# Patient Record
Sex: Female | Born: 1935 | Race: White | Hispanic: No | Marital: Single | Smoking: Never smoker
Health system: Southern US, Community
[De-identification: ages and names within clinical notes are randomized; demographics above are authoritative.]

## PROBLEM LIST (undated history)

## (undated) DIAGNOSIS — M1990 Unspecified osteoarthritis, unspecified site: Secondary | ICD-10-CM

## (undated) DIAGNOSIS — R296 Repeated falls: Secondary | ICD-10-CM

## (undated) DIAGNOSIS — Z87898 Personal history of other specified conditions: Secondary | ICD-10-CM

## (undated) DIAGNOSIS — R531 Weakness: Secondary | ICD-10-CM

## (undated) HISTORY — PX: WRIST SURGERY: SHX841

---

## 2005-08-24 ENCOUNTER — Other Ambulatory Visit: Payer: Self-pay

## 2005-08-24 ENCOUNTER — Inpatient Hospital Stay: Payer: Self-pay | Admitting: Orthopaedic Surgery

## 2005-09-06 ENCOUNTER — Emergency Department: Payer: Self-pay | Admitting: Internal Medicine

## 2007-03-03 ENCOUNTER — Ambulatory Visit: Payer: Self-pay | Admitting: Family Medicine

## 2007-06-16 ENCOUNTER — Ambulatory Visit: Payer: Self-pay | Admitting: Family Medicine

## 2007-06-27 ENCOUNTER — Ambulatory Visit: Payer: Self-pay | Admitting: Family Medicine

## 2008-02-25 ENCOUNTER — Encounter: Payer: Self-pay | Admitting: Internal Medicine

## 2008-03-10 ENCOUNTER — Encounter: Payer: Self-pay | Admitting: Internal Medicine

## 2008-08-02 ENCOUNTER — Ambulatory Visit: Payer: Self-pay | Admitting: Family Medicine

## 2009-10-16 ENCOUNTER — Ambulatory Visit: Payer: Self-pay | Admitting: Internal Medicine

## 2009-11-16 ENCOUNTER — Ambulatory Visit: Payer: Self-pay | Admitting: Family Medicine

## 2009-11-25 ENCOUNTER — Inpatient Hospital Stay: Payer: Self-pay | Admitting: General Practice

## 2009-12-02 ENCOUNTER — Emergency Department: Payer: Self-pay | Admitting: Emergency Medicine

## 2010-01-02 ENCOUNTER — Ambulatory Visit: Payer: Self-pay | Admitting: Family Medicine

## 2010-01-04 ENCOUNTER — Ambulatory Visit: Payer: Self-pay | Admitting: Family Medicine

## 2010-02-02 ENCOUNTER — Ambulatory Visit: Payer: Self-pay | Admitting: Internal Medicine

## 2010-02-09 ENCOUNTER — Ambulatory Visit: Payer: Self-pay | Admitting: Internal Medicine

## 2010-02-13 ENCOUNTER — Ambulatory Visit: Payer: Self-pay | Admitting: Internal Medicine

## 2010-02-20 ENCOUNTER — Ambulatory Visit: Payer: Self-pay | Admitting: Internal Medicine

## 2010-02-27 ENCOUNTER — Ambulatory Visit: Payer: Self-pay | Admitting: Internal Medicine

## 2010-03-06 ENCOUNTER — Ambulatory Visit: Payer: Self-pay | Admitting: Internal Medicine

## 2010-03-10 ENCOUNTER — Ambulatory Visit: Payer: Self-pay | Admitting: Internal Medicine

## 2010-03-15 ENCOUNTER — Ambulatory Visit: Payer: Self-pay | Admitting: Internal Medicine

## 2010-03-21 ENCOUNTER — Ambulatory Visit: Payer: Self-pay | Admitting: Internal Medicine

## 2010-03-28 ENCOUNTER — Ambulatory Visit: Payer: Self-pay | Admitting: Internal Medicine

## 2010-04-04 ENCOUNTER — Ambulatory Visit: Payer: Self-pay | Admitting: Internal Medicine

## 2011-08-30 ENCOUNTER — Ambulatory Visit: Payer: Self-pay | Admitting: Family Medicine

## 2012-03-11 ENCOUNTER — Ambulatory Visit: Payer: Self-pay | Admitting: Family Medicine

## 2013-04-01 ENCOUNTER — Ambulatory Visit: Payer: Self-pay | Admitting: Family Medicine

## 2013-04-14 ENCOUNTER — Ambulatory Visit: Payer: Self-pay | Admitting: Family Medicine

## 2013-07-12 ENCOUNTER — Observation Stay: Payer: Self-pay | Admitting: Internal Medicine

## 2013-07-12 LAB — COMPREHENSIVE METABOLIC PANEL
Albumin: 3.3 g/dL — ABNORMAL LOW (ref 3.4–5.0)
Alkaline Phosphatase: 84 U/L (ref 50–136)
Anion Gap: 1 — ABNORMAL LOW (ref 7–16)
BUN: 20 mg/dL — ABNORMAL HIGH (ref 7–18)
Bilirubin,Total: 0.2 mg/dL (ref 0.2–1.0)
Calcium, Total: 8.9 mg/dL (ref 8.5–10.1)
Chloride: 109 mmol/L — ABNORMAL HIGH (ref 98–107)
Creatinine: 0.87 mg/dL (ref 0.60–1.30)
EGFR (African American): >60
EGFR (Non-African Amer.): 59 — ABNORMAL LOW
Glucose: 107 mg/dL — ABNORMAL HIGH (ref 65–99)
Osmolality: 282 (ref 275–301)
SGPT (ALT): 20 U/L (ref 12–78)
Sodium: 140 mmol/L (ref 136–145)
Total Protein: 6.7 g/dL (ref 6.4–8.2)

## 2013-07-12 LAB — URINALYSIS, COMPLETE
Bilirubin,UR: NEGATIVE
Blood: NEGATIVE
Glucose,UR: NEGATIVE mg/dL (ref 0–75)
Ketone: NEGATIVE
Leukocyte Esterase: NEGATIVE
Ph: 5 (ref 4.5–8.0)
Protein: NEGATIVE
Specific Gravity: 1.02 (ref 1.003–1.030)
WBC UR: 1 /HPF (ref 0–5)

## 2013-07-12 LAB — CBC
HCT: 39.4 % (ref 35.0–47.0)
HGB: 13.6 g/dL (ref 12.0–16.0)
MCHC: 34.6 g/dL (ref 32.0–36.0)
Platelet: 186 10*3/uL (ref 150–440)
RDW: 13.2 % (ref 11.5–14.5)

## 2013-07-13 LAB — CBC WITH DIFFERENTIAL/PLATELET
Eosinophil %: 1.4 %
HGB: 12.3 g/dL (ref 12.0–16.0)
Lymphocyte %: 10.8 %
MCH: 33.5 pg (ref 26.0–34.0)
MCHC: 34.8 g/dL (ref 32.0–36.0)
MCV: 96 fL (ref 80–100)
Monocyte #: 0.5 x10 3/mm (ref 0.2–0.9)
Neutrophil #: 4.7 10*3/uL (ref 1.4–6.5)
Platelet: 173 10*3/uL (ref 150–440)
RBC: 3.69 10*6/uL — ABNORMAL LOW (ref 3.80–5.20)

## 2013-07-13 LAB — BASIC METABOLIC PANEL
Chloride: 111 mmol/L — ABNORMAL HIGH (ref 98–107)
Creatinine: 1.01 mg/dL (ref 0.60–1.30)
EGFR (African American): 57 — ABNORMAL LOW
EGFR (Non-African Amer.): 49 — ABNORMAL LOW
Glucose: 95 mg/dL (ref 65–99)
Osmolality: 284 (ref 275–301)
Potassium: 4.3 mmol/L (ref 3.5–5.1)
Sodium: 142 mmol/L (ref 136–145)

## 2013-09-21 ENCOUNTER — Ambulatory Visit: Payer: Self-pay | Admitting: Family Medicine

## 2014-10-07 ENCOUNTER — Inpatient Hospital Stay: Payer: Self-pay | Admitting: Internal Medicine

## 2014-10-07 LAB — COMPREHENSIVE METABOLIC PANEL
ALBUMIN: 2.9 g/dL — AB (ref 3.4–5.0)
Alkaline Phosphatase: 111 U/L (ref 46–116)
Anion Gap: 5 — ABNORMAL LOW (ref 7–16)
BUN: 23 mg/dL — AB (ref 7–18)
Bilirubin,Total: 0.3 mg/dL (ref 0.2–1.0)
Calcium, Total: 8.7 mg/dL (ref 8.5–10.1)
Chloride: 105 mmol/L (ref 98–107)
Co2: 31 mmol/L (ref 21–32)
Creatinine: 0.88 mg/dL (ref 0.60–1.30)
EGFR (Non-African Amer.): >60
GLUCOSE: 140 mg/dL — AB (ref 65–99)
Osmolality: 287 (ref 275–301)
POTASSIUM: 4.1 mmol/L (ref 3.5–5.1)
SGOT(AST): 32 U/L (ref 15–37)
SGPT (ALT): 20 U/L (ref 14–63)
Sodium: 141 mmol/L (ref 136–145)
Total Protein: 6.6 g/dL (ref 6.4–8.2)

## 2014-10-07 LAB — CBC
HCT: 38.6 % (ref 35.0–47.0)
HGB: 12.8 g/dL (ref 12.0–16.0)
MCH: 32.3 pg (ref 26.0–34.0)
MCHC: 33.2 g/dL (ref 32.0–36.0)
MCV: 97 fL (ref 80–100)
PLATELETS: 183 10*3/uL (ref 150–440)
RBC: 3.97 10*6/uL (ref 3.80–5.20)
RDW: 13.8 % (ref 11.5–14.5)
WBC: 4.2 10*3/uL (ref 3.6–11.0)

## 2014-10-07 LAB — URINALYSIS, COMPLETE
BILIRUBIN, UR: NEGATIVE
Bacteria: NONE SEEN
Blood: NEGATIVE
Glucose,UR: NEGATIVE mg/dL (ref 0–75)
KETONE: NEGATIVE
LEUKOCYTE ESTERASE: NEGATIVE
Nitrite: NEGATIVE
PROTEIN: NEGATIVE
Ph: 7 (ref 4.5–8.0)
RBC, UR: NONE SEEN /HPF (ref 0–5)
Specific Gravity: 1.018 (ref 1.003–1.030)
Squamous Epithelial: <1
WBC UR: <1 /HPF (ref 0–5)

## 2014-10-08 LAB — CBC WITH DIFFERENTIAL/PLATELET
BASOS PCT: 0.3 %
Basophil #: 0 10*3/uL (ref 0.0–0.1)
Eosinophil #: 0 10*3/uL (ref 0.0–0.7)
Eosinophil %: 0.4 %
HCT: 31.5 % — AB (ref 35.0–47.0)
HGB: 10.4 g/dL — AB (ref 12.0–16.0)
LYMPHS PCT: 8.5 %
Lymphocyte #: 0.6 10*3/uL — ABNORMAL LOW (ref 1.0–3.6)
MCH: 32.3 pg (ref 26.0–34.0)
MCHC: 32.9 g/dL (ref 32.0–36.0)
MCV: 98 fL (ref 80–100)
Monocyte #: 0.5 x10 3/mm (ref 0.2–0.9)
Monocyte %: 6.8 %
NEUTROS ABS: 5.7 10*3/uL (ref 1.4–6.5)
Neutrophil %: 84 %
Platelet: 163 10*3/uL (ref 150–440)
RBC: 3.21 10*6/uL — ABNORMAL LOW (ref 3.80–5.20)
RDW: 13.6 % (ref 11.5–14.5)
WBC: 6.7 10*3/uL (ref 3.6–11.0)

## 2014-10-08 LAB — BASIC METABOLIC PANEL
ANION GAP: 5 — AB (ref 7–16)
BUN: 26 mg/dL — ABNORMAL HIGH (ref 7–18)
CHLORIDE: 108 mmol/L — AB (ref 98–107)
CREATININE: 0.78 mg/dL (ref 0.60–1.30)
Calcium, Total: 8.1 mg/dL — ABNORMAL LOW (ref 8.5–10.1)
Co2: 27 mmol/L (ref 21–32)
EGFR (Non-African Amer.): >60
Glucose: 117 mg/dL — ABNORMAL HIGH (ref 65–99)
Osmolality: 285 (ref 275–301)
Potassium: 4.2 mmol/L (ref 3.5–5.1)
Sodium: 140 mmol/L (ref 136–145)

## 2014-10-09 LAB — CBC WITH DIFFERENTIAL/PLATELET
BASOS ABS: 0 10*3/uL (ref 0.0–0.1)
Basophil %: 0.3 %
Eosinophil #: 0.2 10*3/uL (ref 0.0–0.7)
Eosinophil %: 3.8 %
HCT: 24.6 % — ABNORMAL LOW (ref 35.0–47.0)
HGB: 8 g/dL — AB (ref 12.0–16.0)
LYMPHS ABS: 0.3 10*3/uL — AB (ref 1.0–3.6)
Lymphocyte %: 4.8 %
MCH: 32 pg (ref 26.0–34.0)
MCHC: 32.4 g/dL (ref 32.0–36.0)
MCV: 99 fL (ref 80–100)
MONO ABS: 0.5 x10 3/mm (ref 0.2–0.9)
Monocyte %: 8.4 %
Neutrophil #: 4.6 10*3/uL (ref 1.4–6.5)
Neutrophil %: 82.7 %
PLATELETS: 118 10*3/uL — AB (ref 150–440)
RBC: 2.49 10*6/uL — AB (ref 3.80–5.20)
RDW: 13.8 % (ref 11.5–14.5)
WBC: 5.6 10*3/uL (ref 3.6–11.0)

## 2014-10-09 LAB — BASIC METABOLIC PANEL
Anion Gap: 7 (ref 7–16)
BUN: 19 mg/dL — AB (ref 7–18)
CHLORIDE: 110 mmol/L — AB (ref 98–107)
CO2: 26 mmol/L (ref 21–32)
CREATININE: 0.87 mg/dL (ref 0.60–1.30)
Calcium, Total: 7.4 mg/dL — ABNORMAL LOW (ref 8.5–10.1)
Glucose: 86 mg/dL (ref 65–99)
Osmolality: 287 (ref 275–301)
POTASSIUM: 4.1 mmol/L (ref 3.5–5.1)
SODIUM: 143 mmol/L (ref 136–145)

## 2014-10-10 LAB — HEMOGLOBIN
HGB: 7.7 g/dL — AB (ref 12.0–16.0)
HGB: 10 g/dL — AB (ref 12.0–16.0)

## 2014-10-10 LAB — BASIC METABOLIC PANEL
ANION GAP: 5 — AB (ref 7–16)
BUN: 16 mg/dL (ref 7–18)
CHLORIDE: 112 mmol/L — AB (ref 98–107)
CREATININE: 0.77 mg/dL (ref 0.60–1.30)
Calcium, Total: 7.9 mg/dL — ABNORMAL LOW (ref 8.5–10.1)
Co2: 24 mmol/L (ref 21–32)
EGFR (African American): >60
GLUCOSE: 90 mg/dL (ref 65–99)
Osmolality: 282 (ref 275–301)
POTASSIUM: 3.6 mmol/L (ref 3.5–5.1)
Sodium: 141 mmol/L (ref 136–145)

## 2014-12-31 NOTE — Consult Note (Signed)
Brief Consult Note: Diagnosis: left knee.thigh pain post trauma.   Patient was seen by consultant.   Recommend further assessment or treatment.   Comments: severe pain with leg ROM, nontender in back recommend CT left femur looking for occult fracture.  Electronic Signatures: Leitha SchullerMenz, Syvilla Martin J (MD)  (Signed [phone]-Nov-14 07:40)  Authored: Brief Consult Note   Last Updated: 03-Nov-14 07:40 by Leitha SchullerMenz, Precilla Purnell J (MD)

## 2014-12-31 NOTE — H&P (Signed)
PATIENT NAME:  Anna Moss, Dalissa MR#:  [id number] DATE OF BIRTH:  January 05, 1921  DATE OF ADMISSION:  07/12/2013  REFERRING PHYSICIAN: Dr. Darnelle CatalanMalinda  FAMILY PHYSICIAN: Dr. Elizabeth Sauereanna Jones   REASON FOR ADMISSION: Severe left lower extremity pain, with inability to ambulate.   HISTORY OF PRESENT ILLNESS: The patient is a 81 year old female with a history of osteoporosis and multiple orthopedic surgeries, who fell today, injuring her left side. She was brought to the Emergency Room, where plain films of her hip, knee, pelvis and shoulder were negative for fracture. She was having severe pain in the Emergency Room, involving her left knee and leg. She is unable to bear weight or ambulate. She has gotten morphine, with no improvement. She is noted to be mildly dehydrated. She is now admitted for further evaluation.   PAST MEDICAL HISTORY: 1.  Osteoporosis.  2.  Osteoarthritis.  3.  Status post right knee surgery.  4.  Status post left shoulder surgery.  5.  Status post left hip surgery.  6.  Status post back surgery.  7.  Status post ankle surgery.   MEDICATIONS: 1.  Actonel 35 mg p.o. q. week.  2.  Tramadol 50 mg p.o. q. 6 hours p.r.n. pain.   ALLERGIES:  No known drug allergies.   SOCIAL HISTORY:  Negative for alcohol or tobacco abuse.   FAMILY HISTORY:  Noncontributory.   REVIEW OF SYSTEMS:    CONSTITUTIONAL: No fever or change in weight.  EYES: No blurred or double vision. No glaucoma.  ENT: No dizziness. No tinnitus or hearing loss. No nasal discharge or bleeding.  RESPIRATORY:  No cough or wheezing. Denies hemoptysis.  CARDIOVASCULAR:  No chest pain or orthopnea. No palpitations or syncope.  GASTROINTESTINAL: No nausea, vomiting, or diarrhea. No abdominal pain.  GENITOURINARY: No dysuria or hematuria. No incontinence.  ENDOCRINE: No polyuria or polydipsia. No heat or cold intolerance.  HEMATOLOGIC: The patient denies anemia, easy bruising or bleeding.  LYMPHATIC: No swollen  glands.  MUSCULOSKELETAL: The patient denies pain in her neck. No gout.  NEUROLOGIC: No numbness or migraines. Denies stroke or seizures.  PSYCHIATRIC: The patient denies anxiety, insomnia or depression.   PHYSICAL EXAMINATION: GENERAL: The patient is extremely anxious, but in no acute distress.  VITAL SIGNS: Currently remarkable for a blood pressure of 154/78, with a heart rate of 67, a respiratory rate of 16, and a temperature of 97.8.  HEENT: Normocephalic, atraumatic. Pupils equally round and reactive to light and accommodation. Extraocular movements are intact. Sclerae are not icteric. Conjunctivae are clear. Oropharynx is clear.  NECK:  Supple, without JVD. No adenopathy or thyromegaly is noted.  LUNGS: Clear to auscultation and percussion, without wheezes, rales, or rhonchi. No dullness. Respiratory effort is normal.  CARDIAC: Regular rate and rhythm. Normal S1 and S2. There is a 3/6 systolic murmur noted throughout the precordium. No rubs or gallops are present. PMI is nondisplaced.  ABDOMEN: Soft, nontender, with normoactive bowel sounds. No organomegaly or masses were appreciated. No hernias or bruits were noted.  EXTREMITIES: Without clubbing, cyanosis or edema. Pulses were 2+ bilaterally.  SKIN: Warm and dry, without rash or lesions.  NEUROLOGIC: Cranial nerves II through XII grossly intact. Deep tendon reflexes were symmetric. Motor and sensory examination is nonfocal.  PSYCHIATRIC: Revealed patient who was alert and oriented to person, place, and time. She was cooperative and used good judgment.   LABORATORY DATA: Plain films of the left hip, left knee, and left shoulder show severe osteoporosis and arthrosis, with  no acute fracture. White count is 6.0, with a hemoglobin of 13.6. Glucose is 107, with a BUN of 20, creatinine of 0.87, with a sodium of 140, potassium of 4.1.   ASSESSMENT: 1.  Dehydration.  2.  Ambulatory dysfunction.  3.  Severe left lower extremity pain.  4.   Osteoporosis. 5.  Osteoarthritis.  6.  Diffuse weakness.   PLAN: The patient will be admitted to Orthopedics, with IV morphine as needed for pain. Will begin Voltaren gel and use Ativan as needed for anxiety. Will continue her Actonel. Zofran as needed for nausea. Will monitor her blood pressure closely. Will obtain consults from Orthopedics and Physical Therapy. Will also consult the social worker for placement. Will send off urinalysis at this time, and obtain an EKG. Will check an echocardiogram because of her loud murmur. Further treatment and evaluation will depend upon the patient's progress.   Total time spent on this patient was 50 minutes.    ____________________________ Duane Lope Judithann Sheen, MD jds:mr D: 07/12/2013 19:03:41 ET T: 07/12/2013 19:54:18 ET JOB#: [id number]  cc: Duane Lope. Judithann Sheen, MD, <Dictator> Duanne Limerick, MD  Noora Locascio Rodena Medin MD ELECTRONICALLY SIGNED 07/13/2013 7:32

## 2014-12-31 NOTE — Discharge Summary (Signed)
PATIENT NAME:  Anna Moss, Anna MR#:  [id number] DATE OF BIRTH:  07-30-1921  DATE OF ADMISSION:  07/12/2013 DATE OF DISCHARGE:  07/13/2013  ADMISSION DIAGNOSIS: Ambulatory dysfunction secondary to left leg pain.   DISCHARGE DIAGNOSES: 1. Left leg pain with ambulatory dysfunction. No evidence of a fracture on CT scan.  2. Osteoporosis.  3. Severe mitral regurgitation.   CONSULTATIONS: Dr. Rosita KeaMenz.   IMAGING: CT scan of the femur showed no evidence of any femur fracture and no evidence of hip injury.   Echocardiogram ejection fraction 55% to 60% with moderately dilated left atrium, severe mitral regurgitation with moderately elevated PAPs.   Discharge white blood cells 6, hemoglobin 12.3, hematocrit 36, platelets 173, sodium 142, potassium 4.3, chloride 111, bicarb 29, BUN 17, creatinine 1.01, glucose 95.   HOSPITAL COURSE: This is a very pleasant 81 year old female with a history of osteoporosis who presented with excruciating left leg pain after a fall on the left side. For further details, please refer to the H and P.  1. Ambulatory dysfunction with left leg pain. The patient was admitted under observation for further evaluation for the left leg pain. Physical therapy was consulted as well as orthopedic surgery. Dr. Rosita KeaMenz recommended a CT scan to evaluate for fracture. This was negative. He; however, does recommend if the patient is still having ambulatory issues in the next week or so. He recommends follow-up with him for possible bone scan to look for occult fracture. The patient is receiving some p.r.n. tramadol, which is controlling her pain.  2. Severe mitral regurgitation. We will do defer this to Dr. Elizabeth Sauereanna Jones her primary care physician.  3. Osteoporosis. The patient will continue outpatient medications.   DISCHARGE MEDICATIONS: 1. Actonel  35 mg weekly.  2. Tramadol 50 mg b.i.d.  3. Tramadol 50 mg q.6h. p.r.n.  4. DICLOFENLAC  1% topical to affected area.  5. Tylenol 325, 2  tablets q.4 hours p.r.n. pain or temperature.   DISCHARGE DIET: Regular diet.   DISCHARGE FOLLOW-UP: The patient will follow up with Dr. Rosita KeaMenz in 1 to 2 weeks as well as with Dr. Elizabeth Sauereanna Jones.    TIME SPENT: Approximately 35 minutes.   The patient is medically stable for discharge.   ____________________________ Tai Syfert P. Juliene PinaMody, MD spm:sg D: 07/13/2013 13:51:29 ET T: 07/13/2013 14:13:14 ET JOB#: [id number]  cc: Makhi Muzquiz P. Juliene PinaMody, MD, <Dictator> Leitha SchullerMichael J. Menz, MD Duanne Limerickeanna C. Jones, MD  Janyth ContesSITAL P Kendyl Bissonnette MD ELECTRONICALLY SIGNED 07/13/2013 15:33

## 2015-01-09 NOTE — Op Note (Signed)
PATIENT NAME:  Anna Moss, Anna Moss MR#:  [id number] DATE OF BIRTH:  Nov 15, 1920  DATE OF PROCEDURE:  10/08/2014  PREOPERATIVE DIAGNOSES:  1.  Three part intertrochanteric fracture, right hip.  2.  Displaced right distal radius fracture.  POSTOPERATIVE DIAGNOSES:  1.  Three part intertrochanteric fracture, right hip.  2.  Displaced right distal radius fracture.  PROCEDURE:  1.  Trochanteric femoral nailing of right hip intertrochanteric fracture using a Biomet Affixus 11x380 mm nail with proximal and distal interlocking screws.  2.  Closed reduction and percutaneous pinning of right distal radius fracture.   SURGEON: Maryagnes Amos, MD   ANESTHESIA: Spinal with a supplemental LMA for the distal radius procedure.   FINDINGS: As noted above.   COMPLICATIONS: None.   ESTIMATED BLOOD LOSS: 50 mL.   TOTAL FLUIDS: Crystalloid 1200 mL.   URINE OUTPUT: 150 mL   TOURNIQUET: None.   DRAINS: None.   CLOSURE: Staples.   BRIEF CLINICAL NOTE: The patient is a 81 year old female who sustained the above-noted injuries yesterday afternoon when she apparently fell in her apartment while trying to close the dryer door in her laundry room. She was brought to the Emergency Room where x-rays confirmed the above-noted findings. She has been cleared medically and presents at this time for definitive management of her injuries.   PROCEDURE IN DETAIL: The patient was brought into the operating room. After adequate IV sedation was achieved, a spinal was placed by the anesthesiologist. She was lain in the supine position on the fracture table. The right wrist was approached first. After a Foley catheter was placed and the patient underwent general laryngeal mask anesthesia, the right distal radius fracture was reduced gently using manipulation and longitudinal traction through fingertraps and 5 pounds of countertraction. The adequacy of reduction was verified fluoroscopically in AP and lateral  projections. The wrist area was prepped with ChloraPrep solution before being sterilely draped. Using sterile technique, two 0.062 K-wires were placed to stabilize the fracture. The first was placed through the radial styloid obliquely directed from radial to ulnar and distal to proximal across the fracture site. The second K-wire was placed through Lister tubercle dorsally and advanced proximally and volarly. The adequacy of reduction and pin position was, again, verified fluoroscopically in AP and lateral projections and found to be quite satisfactory. The pins were cut short and pin caps applied. A sterile bulky dressing was applied around the pin sites before the patient was placed into a sugar tong splint with the wrist maintained at neutral position.   Attention was directed to the hip. The patient was repositioned on the fracture table. Her left leg was placed in a flexed and abducted position on the well leg holder while the right leg was put in longitudinal traction. The fracture was carefully reduced using longitudinal traction and internal rotation. The adequacy of reduction was verified fluoroscopically in AP and lateral projections and found to be near anatomic. The lateral aspect of the right hip and thigh were prepped with ChloraPrep solution before being draped sterilely. Preoperative antibiotics had been administered already. Under fluoroscopic guidance, the center of the femur was marked on the skin on both the anterior and lateral planes. The tip of the trochanter also was identified and this point marked on the lateral aspect of her hip. An approximately 3-4 cm incision was made approximately 3 fingerbreadths proximal to the greater trochanter tip. The incision was carried down through the fascia before the curved awl was used to establish appropriate  position on the medial edge of the tip of the greater trochanter. The guide drill was inserted and advanced into the metaphysis of the proximal  femur. The adequacy of guidewire position was verified fluoroscopically in AP and lateral projections and found to be excellent. This was overreamed using the starter reamer to the appropriate depth. The beaded guidewire was inserted and advanced down to the distal femur. Its position again was verified fluoroscopically in AP and lateral projections. The femur was reamed sequentially beginning with a 9 mm flexible reamer and progressing to a 13 mm flexible reamer. The length of the proposed nail was measured. An 11x380 mm nail was selected. This was applied to the inserter and then advanced down over the guidewire through the femur and advanced so that it was in proper position based on where the lag screw would enter the femoral neck and head. Once this was verified in AP and lateral projections, a short stab incision was made over the lateral aspect of the femur in the appropriate place. The drill was placed up through the femoral neck into the femoral head within 5 mm of subchondral bone. Its length was measured and a 100 mm lag screw selected. The guidewire was overdrilled before the lag screw was advanced to the appropriate depth. It was elected to compress the fracture, so the appropriate compression device was applied. Tension was let off the distal femur and appropriate compression performed under fluoroscopic guidance. Once this was accomplished, the locking screw was tightened proximally before being loosened a quarter turn. The adequacy of locking of the lag screw was verified in that the inserter handle could not be turned. The guide system was removed.   Attention was then directed to the distal femur. After obtaining concentric circles under fluoroscopy, a stab incision was made over the static screw hole and this incision carried down through the IT band. The drill was placed appropriately and drilled across the hole out the medial cortex, again as verified fluoroscopically. The appropriate length  screw was selected (38 mm) and advanced and tightened securely to interlock the nail distally. Again, the adequacy of screw position was verified fluoroscopically in AP and lateral projections.   The wounds were copiously irrigated with sterile saline solution. The abductor fascia proximally was closed using #0 Vicryl interrupted sutures. The subcutaneous tissues of all 3 wounds were closed using 2-0 Vicryl interrupted sutures before the skin was closed using staples. Sterile bulky dressings were applied to all wounds before the patient was awakened, and returned to her hospital bed. She was then brought back to the recovery room in satisfactory condition after tolerating the procedure well.   ____________________________ J. Derald MacleodJeffrey Cassey Bacigalupo, MD jjp:bm D: 10/08/2014 16:33:38 ET T: 10/08/2014 23:34:04 ET JOB#: [id number]  cc: Maryagnes AmosJ. Jeffrey Wylma Tatem, MD, <Dictator> JEFF Fidel LevyJ Tierra Divelbiss MD ELECTRONICALLY SIGNED 10/10/2014 11:47

## 2015-01-09 NOTE — Consult Note (Signed)
PATIENT NAME:  Anna Moss, Anna Moss MR#:  [id number] DATE OF BIRTH:  [birth year]  DATE OF CONSULTATION:  10/07/2014  REFERRING PHYSICIAN:  Herschell Dimesichard J. Renae GlossWieting, MD  CONSULTING PHYSICIAN:  Maryagnes AmosJ. Jeffrey Poggi, MD  REASON FOR CONSULTATION: I have been asked by Dr. Renae GlossWieting to evaluate this pleasant woman for right wrist and right hip injuries.   HISTORY OF PRESENT ILLNESS: Briefly, she is a 81 year old female who lives alone. She apparently was in the laundry room when she bent down to open the dryer door. Her foot slipped and she fell backwards, landing on her right side and outstretched right hand. She was brought to the Emergency Room, where x-rays were obtained. These films demonstrated a 3-part displaced intertrochanteric fracture of her right hip, as well as a dorsally displaced fracture of the right distal radius with an ulnar styloid fracture. The patient has been admitted by the hospitalist service for pain control and medical clearance prior to definitive management of these injuries.   PAST MEDICAL HISTORY: Notable for osteoporosis, as well as for a nonhealing area on the left side of her face, which has been present for a year.   PAST SURGICAL HISTORY: Status post IM nailing of a left intertrochanteric hip fracture approximately 3 years ago, as well as status post an appendectomy in the past.   MEDICATIONS ON ADMISSION: Acetaminophen p.r.n., Actonel 35 mg weekly, calcium with vitamin D 1 tablet q.a.m., a multivitamin q.a.m., Colace 100 mg twice daily, loratadine 10 mg daily, melatonin 3 mg at bedtime, Singulair 10 mg daily, ProAir HFA 90 mcg as necessary, tramadol twice daily, Senolax 8.6 mg once daily, and Robitussin as necessary.   ORTHOPEDIC EXAMINATION: We have a pleasant, elderly female resting comfortably in bed. She is alert and oriented x 3. Orthopedic examination is limited to the right wrist and right lower extremity. The right wrist demonstrates significant swelling with an obvious  silver spoon deformity of the wrist. She has moderate tenderness to palpation in the in the distal radius region. She is able to gently flex and extend her fingers actively, although motion is limited due to pain. She appears to be neurovascularly intact to all digits and she has intact sensation to light touch to the tips of all digits and good capillary refill to the tips of all digits. Skin overlying the wrist is intact.   Inspection of the right lower extremity demonstrates the right lower extremity to be somewhat shortened and externally rotated as compared to the left. She has significant pain with any attempt at active or passive motion of the right hip. Skin inspection around the hip is unremarkable. There are no rashes, erythema, ecchymosis, abrasions, or other abnormalities. She is able to actively dorsiflex and plantarflex her toes and ankle. She has intact sensation to light touch to all distributions of her right foot and lower leg. She has good capillary refill to her right foot.   IMAGING STUDIES: X-rays of the right wrist and right hip are available for review. These findings are as described above.   ADMISSION LABORATORY DATA: Notable for a slightly elevated glucose at 140 and a slightly elevated BUN at 23. Her total protein is 6.6, although her serum albumin is only 2.9. Her white count is 4.2. She has a hematocrit of 38.6 with a hemoglobin of 12.8 and a platelet count of 183,000.   IMPRESSION:  1. Displaced intertrochanteric fracture of right hip.  2. Displaced right distal radius fracture with ulnar styloid fracture.   PLAN: Other  treatment options have been discussed with the patient. The patient would like to proceed with definitive management of her injuries, to include intramedullary nailing of the right hip intertrochanteric fracture and closed reduction and possible percutaneous pinning of the right distal radius fracture with splinting. These procedures have been discussed in  detail with the patient, as have the potential risks (including bleeding, infection, nerve and/or blood vessel injury, persistent or recurrent pain, stiffness of the hip and/or wrist, malunion and/or nonunion, need for further surgery, blood clots, strokes, heart attacks and/or arrhythmias) and benefits. The patient states her understanding and wishes to proceed.   Thank you for asking me to participate in the care of this most pleasant woman. I will be happy to follow her with you.   ____________________________ J. Derald Macleod, MD jjp:mw D: 10/07/2014 18:55:33 ET T: 10/07/2014 19:21:25 ET JOB#: [id number]  cc: Maryagnes Amos, MD, <Dictator> JEFF Fidel Levy MD ELECTRONICALLY SIGNED 10/10/2014 11:42

## 2015-01-09 NOTE — Consult Note (Signed)
Brief Consult Note: Diagnosis: Right hip IT fracture, Right distal radius and ulna fractures.   Patient was seen by consultant.   Consult note dictated.   Recommend to proceed with surgery or procedure.   Orders entered.   Comments: To OR tonight for definitive stabilization of right wrist and right hip fractures. Thanks!.  Electronic Signatures: Ninfa LindenPoggi, Jeff (MD)  (Signed 28-Jan-16 18:47)  Authored: Brief Consult Note   Last Updated: 28-Jan-16 18:47 by Ninfa LindenPoggi, Jeff (MD)

## 2015-01-09 NOTE — Discharge Summary (Signed)
PATIENT NAME:  Anna Moss, Shiree MR#:  [id number] DATE OF BIRTH:  [birth year]  DATE OF ADMISSION:  10/07/2014 DATE OF DISCHARGE:  10/11/2014  ADMITTING DIAGNOSIS: Fall.   DISCHARGE DIAGNOSES: 1.  Right hip fracture.  2.  Right distal radius fracture and distal ulnar fracture.  3.  Osteoporosis.  4.  Acute blood loss secondary to surgery.  5.  Nonhealing area on the left face/nose present for greater than 1 year.   CONSULTATIONS: Dr. Ninfa LindenJeff Poggi, orthopedics.   PROCEDURES: 1.  Closed reduction and percutaneous pinning of right distal radius fracture.  2.  Open reduction internal fixation of the right hip intertrochanteric fracture.   HISTORY OF PRESENT ILLNESS: This is a 81 year old female with past medical history of osteoporosis, presented to the Emergency Room after a fall. Fall was mechanical, she slipped and fell backward. In the Emergency Room, she was found to have right hip and right wrist fractures and was admitted for further treatment and evaluation.   HOSPITAL COURSE AND TREATMENT: 1.  Right hip fracture: The patient was seen by Dr. Joice LoftsPoggi, orthopedics, and she had open reduction internal fixation on January 29th. Procedure occurred with minor blood loss. Otherwise, no other complications. She has been progressing slowly, but well with physical therapy. She is on a minimal pain regimen at this time. She is being discharged to skilled nursing facility for further rehabilitation.  2.  Right radius and ulnar fractures: She was seen by Dr. Joice LoftsPoggi and had closed reduction and pinning of the right radial fracture. She had a brace over the right wrist. She has some significant swelling in the right hand, but good motion of the hand. She is using a walker with a shelf for her right arm with physical therapy.  3.  Osteoporosis: The patient has been on a bisphosphonate and calcium supplement prior to admission and she continues on the same.  4.  Acute blood loss anemia after surgery: Prior  to surgery, hemoglobin was 10.4. After surgery hemoglobin dropped to a low of 7.7. She was transfused 1 unit of packed red blood cells. Hemoglobin increased to 10. There has been no observed bleeding after the surgery.  DISCHARGE PHYSICAL EXAMINATION: VITAL SIGNS: Temperature 98.4, heart rate 89, respirations 20, blood pressure 125/60, oxygenation 91% on 2 liters nasal cannula. GENERAL: No acute distress.  CARDIOVASCULAR: Regular rate and rhythm. There is a systolic ejection murmur. No edema. Peripheral pulses 2+.  PULMONARY: Lungs clear to auscultation bilaterally with good air movement.  ABDOMEN: Soft, nontender, nondistended. Bowel sounds are normal. No guarding. No rebound. No mass.  EXTREMITIES: She has good motion of all extremities. The right hip dressing is clean, dry and intact. The right hand is very swollen, but she has good range of motion in her fingers. Pain is controlled.  PSYCHIATRIC: The patient alert and oriented with good insight into her clinical conditions.   DIAGNOSTIC DATA: Sodium 141, potassium 3.6, chloride 112, bicarb 24, BUN 16, creatinine 0.77, glucose 90. LFTs are normal. Hemoglobin is 10 on discharge. UA shows no signs of infection.   CONDITION ON DISCHARGE: Stable.   DISPOSITION: Discharged to skilled nursing facility.   DISCHARGE MEDICATIONS: 1.  Actonel 35 mg 1 tablet once a week.  2.  Calcium/vitamin D 1 tablet once a day.  3.  Centrum Silver 1 tablet once a day.  4.  Docusate sodium 100 mg 1 capsule every 12 hours.  5.  Loratadine 10 mg 1 tablet once a day in the morning.  6.  Melatonin 3 mg 1 tablet once a day.  7.  Montelukast 10 mg 1 tablet once a day.  8.  Senna laxative 8.6 mg 1 tablet once a day at bedtime.  9.  Acetaminophen 325 two tablets every 4 hours as needed for pain or fever.  10.  ProAir HFA CFC free 90 mcg/inhalation 2 puffs inhaled every 4 hours as needed for shortness of breath.  11.  Robitussin 100 mg/5 mL 15 mL orally every 6 hours as  needed for cough.  12.  Enoxaparin 40 mg/0.4 mL injectable solution 40 mg injectable once a day.  13.  Magnesium hydroxide 8% oral suspension 30 mL orally 2 times a day as needed for constipation.   DISCHARGE DIET: Regular diet.   DISCHARGE ACTIVITY: Per physical therapy and orthopedics' recommendations.   DISCHARGE FOLLOWUP: She will follow up with [facility] orthopedics in 10 to 14 days after discharge.   TIME SPENT ON DISCHARGE: 40 minutes. ____________________________ Ena Dawley. Clent Ridges, MD cpw:sb D: 10/11/2014 08:08:02 ET T: 10/11/2014 08:20:31 ET JOB#: [id number]  cc: Ena Dawley. Clent Ridges, MD, <Dictator> Gale Journey MD ELECTRONICALLY SIGNED 10/12/2014 9:47

## 2015-01-09 NOTE — H&P (Signed)
PATIENT NAME:  Anna Moss, Anna Moss MR#:  [id number] DATE OF BIRTH:  12/23/1920  DATE OF ADMISSION:  10/07/2014  [place]  CHIEF COMPLAINT: She had a fall.   HISTORY OF PRESENT ILLNESS: This is a 81 year old female with osteoporosis. She was in the laundry room. She bent down to open the dryer door. Her foot slipped and she fell backwards, had terrible pain in her wrist and hip, and came to the ER for further evaluation, and found to have a right hip and right wrist fracture. Hospitalist services were contacted for further evaluation.   PAST MEDICAL HISTORY: Osteoporosis, a non-healing area on the left face going on for a year.   PAST SURGICAL HISTORY: Appendectomy, hip fracture.   ALLERGIES: NO KNOWN DRUG ALLERGIES.   MEDICATIONS: Include acetaminophen 325 mg 2 tablets every 4 hours as needed for pain, Actonel 35 mg once a week, amoxicillin was started September 23, 2014, 500 mg twice a day, calcium and vitamin D 1 tablet in the morning, Centrum Silver 1 tablet daily, Colace 100 mg every 12 hours, loratadine 10 mg daily, melatonin 3 mg at bedtime, Singulair 10 mg daily, ProAir HFA 90 mcg per inhalation p.r.n., senna lax 8.6 mg once a day, tramadol 50 mg twice a day, Robitussin p.r.n.   SOCIAL HISTORY[facility] No smoking, alcohol, or drug use. 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   FAMILY HISTORY: Father died of pneumonia. Mother died of multiple sclerosis.   REVIEW OF SYSTEMS:  CONSTITUTIONAL: No fever, chills, or sweats. No weight loss. No weight gain.  EYES: Does wear glasses.  EARS, NOSE, MOUTH AND THROAT: No hearing loss. No sore throat. No difficulty swallowing.  CARDIOVASCULAR: No chest pain. No palpitations.  RESPIRATORY: No shortness of breath. No cough. No sputum. No hemoptysis.  GASTROINTESTINAL: No nausea. No vomiting. No abdominal pain. No diarrhea. No constipation. No bright red blood per rectum. No  melena.  GENITOURINARY: No burning on urination or hematuria.  MUSCULOSKELETAL: Positive for right hip pain and right wrist pain.  INTEGUMENT: No rashes or eruptions, but does have a non-healing lesion on the face.  PSYCHIATRIC: Positive for depression.  ENDOCRINE: No thyroid problems.  HEMATOLOGIC AND LYMPHATIC: No anemia, no easy bruising or bleeding.   PHYSICAL EXAMINATION: VITAL SIGNS: Temperature 97.22, pulse 64, respirations 18, blood pressure 167/73, pulse oximetry 92% on room air.  GENERAL: No respiratory distress.  EYES: Conjunctivae and lids normal. Pupils equal, round, and reactive to light. Extraocular muscles intact. No nystagmus.  EARS, NOSE, MOUTH AND THROAT: Tympanic membranes: No erythema on the right. On the left, unable to be visualized, high canal. Lips and gums: No lesions.  NECK: No JVD. No bruits. No lymphadenopathy. No thyromegaly. No thyroid nodules palpated.  RESPIRATORY: Lungs clear to auscultation. No use of accessory muscles to breathe. No rhonchi, rales, or wheeze heard.  CARDIOVASCULAR: S1, S2 normal. No gallops, rubs, or murmurs heard. Carotid upstroke 2+ bilaterally. No bruits. Dorsalis pedis pulses 2+ bilaterally. No edema of the lower extremity.  ABDOMEN: Soft, nontender. No organomegaly/splenomegaly. Normoactive bowel sounds. No masses felt.  LYMPHATIC: No lymph nodes in the neck.  MUSCULOSKELETAL: No clubbing, edema, cyanosis.  SKIN: No rashes or ulcers seen.  NEUROLOGIC: Cranial nerves II through XII grossly intact. Deep tendon reflexes 2+ bilateral lower extremities.  EXTREMITIES:  Right wrist swelling and bruising.  Right leg shortened and externally rotated. PSYCHIATRIC: The patient is oriented to person, place, and time.  LABORATORY AND RADIOLOGICAL DATA: White blood cell count 4.2, hemoglobin and hematocrit 12.8 and 38.6, platelet count of 186, glucose 140, BUN 23, creatinine 0.88, sodium 141, potassium 4.1, chloride 105, CO2 31, calcium 8.7. Liver  function tests normal range. Albumin low at 2.9. Chest x-ray shows a 1.4 x 1.3 cm nodule opacity at the right base. Right hip shows intertrochanteric proximal right femur fracture, right wrist fracture, posterior displaced and comminuted distal right radius fracture is noted. Mildly displaced communicated distal ulnar fracture noted as well. EKG normal sinus rhythm, 76 beats per minute. No acute ST-T wave changes.   ASSESSMENT AND PLAN:  1. Preoperative hip fracture. No contraindications to surgery at this time. The hip fracture is on the right side, closed, initial encounter. The patient is willing to undergo risks of surgery in order to walk again. If the patient does not walk again, risk of blood clot, infection, and deep vein thrombosis explained.  2. Lung nodule on chest x-ray. Will need workup after surgery.  3. Skin lesion on the left face. Will need dermatology appointment as outpatient.  4. Osteoporosis, on Actonel as outpatient.  5. History of asthma. Will continue the Singulair inhaler preoperatively.   TIME SPENT ON ADMISSION: 55 minutes.   CODE STATUS: The patient wishes to be a DO NOT RESUSCITATE.   Case discussed with son, Anselmo Rod at phone number [phone].   ____________________________ Herschell Dimes. Renae Gloss, MD rjw:ap D: 10/07/2014 16:15:10 ET T: 10/07/2014 16:30:19 ET JOB#: [id number]  cc: Herschell Dimes. Renae Gloss, MD, <Dictator> Unknown cc Salley Scarlet MD ELECTRONICALLY SIGNED 10/08/2014 14:45

## 2015-01-17 ENCOUNTER — Emergency Department: Payer: Medicare Other

## 2015-01-17 ENCOUNTER — Emergency Department
Admission: EM | Admit: 2015-01-17 | Discharge: 2015-01-17 | Disposition: A | Discharge status: Home / Self Care | Payer: Medicare Other | Attending: Emergency Medicine | Admitting: Emergency Medicine

## 2015-01-17 ENCOUNTER — Encounter: Payer: Self-pay | Admitting: Emergency Medicine

## 2015-01-17 DIAGNOSIS — Z79899 Other long term (current) drug therapy: Secondary | ICD-10-CM | POA: Diagnosis not present

## 2015-01-17 DIAGNOSIS — S40011A Contusion of right shoulder, initial encounter: Secondary | ICD-10-CM | POA: Diagnosis not present

## 2015-01-17 DIAGNOSIS — T148XXA Other injury of unspecified body region, initial encounter: Secondary | ICD-10-CM

## 2015-01-17 DIAGNOSIS — Y9289 Other specified places as the place of occurrence of the external cause: Secondary | ICD-10-CM | POA: Insufficient documentation

## 2015-01-17 DIAGNOSIS — W1839XA Other fall on same level, initial encounter: Secondary | ICD-10-CM | POA: Diagnosis not present

## 2015-01-17 DIAGNOSIS — Y9389 Activity, other specified: Secondary | ICD-10-CM | POA: Diagnosis not present

## 2015-01-17 DIAGNOSIS — W19XXXA Unspecified fall, initial encounter: Secondary | ICD-10-CM

## 2015-01-17 DIAGNOSIS — Y998 Other external cause status: Secondary | ICD-10-CM | POA: Insufficient documentation

## 2015-01-17 DIAGNOSIS — S4991XA Unspecified injury of right shoulder and upper arm, initial encounter: Secondary | ICD-10-CM | POA: Diagnosis present

## 2015-01-17 HISTORY — DX: Unspecified osteoarthritis, unspecified site: M19.90

## 2015-01-17 MED ORDER — ACETAMINOPHEN 325 MG PO TABS
ORAL_TABLET | ORAL | Status: AC
  Administered 2015-01-17: 650 mg via ORAL
  Filled 2015-01-17: qty 2

## 2015-01-17 MED ORDER — ACETAMINOPHEN 325 MG PO TABS
650.0000 mg | ORAL_TABLET | Freq: Once | ORAL | Status: AC
  Administered 2015-01-17: 650 mg via ORAL

## 2015-01-17 NOTE — Discharge Instructions (Signed)
Please seek medical attention for any high fevers, chest pain, shortness of breath, change in behavior, persistent vomiting, bloody stool or any other new or concerning symptoms.  Contusion A contusion is a deep bruise. Contusions happen when an injury causes bleeding under the skin. Signs of bruising include pain, puffiness (swelling), and discolored skin. The contusion may turn blue, purple, or yellow. HOME CARE   Put ice on the injured area.  Put ice in a plastic bag.  Place a towel between your skin and the bag.  Leave the ice on for 15-20 minutes, 03-04 times a day.  Only take medicine as told by your doctor.  Rest the injured area.  If possible, raise (elevate) the injured area to lessen puffiness. GET HELP RIGHT AWAY IF:   You have more bruising or puffiness.  You have pain that is getting worse.  Your puffiness or pain is not helped by medicine. MAKE SURE YOU:   Understand these instructions.  Will watch your condition.  Will get help right away if you are not doing well or get worse. Document Released: 02/13/2008 Document Revised: 11/19/2011 Document Reviewed: 07/02/2011 [facility] Patient Information 2015 [employer] [place]. This information is not intended to replace advice given to you by your health care provider. Make sure you discuss any questions you have with your health care provider.

## 2015-01-17 NOTE — ED Notes (Signed)
Presents to ed for evaluation of Bilateral shoulder pain s/p fall @ facility. Received A&O*2, speaking full sentences. Complaining of 8/10 shoulder pain with movement.

## 2015-01-17 NOTE — ED Provider Notes (Signed)
[facility] Emergency Department Provider Note    ____________________________________________  Time seen: At EMS arrival  I have reviewed the triage vital signs and the nursing notes.   HISTORY  Chief Complaint Fall   History limited by: Not Limited   HPI Anna Moss is a 81 y.o. female Who presents to the emergency department today with right-sided shoulder pain after a mechanical fall prior to presentation. Patient states she reached for her walker, it was not locked and she fell. States she did not hit her head or lose consciousness. Is now complaining of pain in her right shoulder. She states she has her same shoulder previously in the past. Denies any neck pain. Denies any recent chest pain, abdominal pain, shortness of breath, fevers.     Past Medical History  Diagnosis Date  . Arthritis     There are no active problems to display for this patient.   Past Surgical History  Procedure Laterality Date  . Wrist surgery      pt states "abourt six months ago"    Current Outpatient Rx  Name  Route  Sig  Dispense  Refill  . OVER THE COUNTER MEDICATION   Nasal   Place 1 spray into the nose daily. Pt states using OTC nasal spray for congestion; unknown drug           Allergies Review of patient's allergies indicates no known allergies.  No family history on file.  Social History History  Substance Use Topics  . Smoking status: Never Smoker   . Smokeless tobacco: Never Used  . Alcohol Use: No    Review of Systems  Constitutional: Negative for fever. Cardiovascular: Negative for chest pain. Respiratory: Negative for shortness of breath. Gastrointestinal: Negative for abdominal pain, vomiting and diarrhea. Genitourinary: Negative for dysuria. Musculoskeletal: right shoulder pain Skin: Negative for rash. Neurological: Negative for headaches, focal weakness or numbness.   10-point ROS otherwise  negative.  ____________________________________________   PHYSICAL EXAM:  VITAL SIGNS: ED Triage Vitals  Enc Vitals Group     BP 01/17/15 1944 154/71 mmHg     Pulse Rate 01/17/15 1944 87     Resp --      Temp 01/17/15 1944 99.9 F (37.7 C)     Temp Source 01/17/15 1944 Oral     SpO2 01/17/15 1944 87 %     Weight 01/17/15 1944 85 lb (38.556 kg)     Height 01/17/15 1944 5\' 1"  (1.549 m)     Head Cir --      Peak Flow --      Pain Score 01/17/15 1946 8   Constitutional: Alert and oriented. Well appearing and in no distress. Eyes: Conjunctivae are normal. PERRL. Normal extraocular movements. ENT   Head: Normocephalic and atraumatic.no lacerations, abrasions or hematomas.   Nose: No congestion/rhinnorhea.   Mouth/Throat: Mucous membranes are moist.   Neck: No stridor.no midline tenderness. Hematological/Lymphatic/Immunilogical: No cervical lymphadenopathy. Cardiovascular: Normal rate, regular rhythm.  No murmurs, rubs, or gallops. Respiratory: Normal respiratory effort without tachypnea nor retractions. Breath sounds are clear and equal bilaterally. No wheezes/rales/rhonchi. Gastrointestinal: Soft and nontender. No distention.  Genitourinary: Deferred Musculoskeletal: tenderness palpation of the right shoulder. Decreased range of motion secondary to pain. Neurovascularly intact distally. Left shoulder with full range of motion, painless. No significant tenderness palpation. Pelvis stable. Lower extremities without any pain or tenderness. Full range of motion. Neurologic:  Normal speech and language. No gross focal neurologic deficits are appreciated. Speech is  normal.  Skin:  Skin is warm, dry and intact. No rash noted. Psychiatric: Mood and affect are normal. Speech and behavior are normal. Patient exhibits appropriate insight and judgment.  ____________________________________________    LABS (pertinent  positives/negatives)  None  ____________________________________________   EKG  None  ____________________________________________    RADIOLOGY  FINDINGS: Diffuse osteopenia. No fracture or dislocation.  IMPRESSION: No fracture or dislocation.  ____________________________________________   PROCEDURES  Procedure(s) performed: None  Critical Care performed: No  ____________________________________________   INITIAL IMPRESSION / ASSESSMENT AND PLAN / ED COURSE  Pertinent labs & imaging results that were available during my care of the patient were reviewed by me and considered in my medical decision making (see chart for details).  Patient presents after a mechanical fall complaining of right shoulder pain. On exam patient with some tenderness and decreased range of motion of the right shoulder. No other obvious traumatic injuries. Will get an x-ray.  ----------------------------------------- 9:08 PM on 01/17/2015 -----------------------------------------  X-ray without any acute fracture or dislocation. Patient states  she has oxygen at home. Will encourage patient is on oxygen.  ____________________________________________   FINAL CLINICAL IMPRESSION(S) / ED DIAGNOSES  Final diagnoses:  Fall, initial encounter  Contusion     Phineas SemenGraydon Estreya Clay, MD 01/17/15 2109

## 2015-03-01 ENCOUNTER — Emergency Department: Payer: Medicare Other

## 2015-03-01 ENCOUNTER — Emergency Department
Admission: EM | Admit: 2015-03-01 | Discharge: 2015-03-01 | Disposition: A | Discharge status: Home / Self Care | Payer: Medicare Other | Attending: Emergency Medicine | Admitting: Emergency Medicine

## 2015-03-01 ENCOUNTER — Encounter: Payer: Self-pay | Admitting: Emergency Medicine

## 2015-03-01 DIAGNOSIS — Y9289 Other specified places as the place of occurrence of the external cause: Secondary | ICD-10-CM | POA: Insufficient documentation

## 2015-03-01 DIAGNOSIS — S0083XA Contusion of other part of head, initial encounter: Secondary | ICD-10-CM | POA: Diagnosis not present

## 2015-03-01 DIAGNOSIS — Z8739 Personal history of other diseases of the musculoskeletal system and connective tissue: Secondary | ICD-10-CM | POA: Diagnosis not present

## 2015-03-01 DIAGNOSIS — T148XXA Other injury of unspecified body region, initial encounter: Secondary | ICD-10-CM

## 2015-03-01 DIAGNOSIS — S0990XA Unspecified injury of head, initial encounter: Secondary | ICD-10-CM | POA: Diagnosis present

## 2015-03-01 DIAGNOSIS — Y9389 Activity, other specified: Secondary | ICD-10-CM | POA: Diagnosis not present

## 2015-03-01 DIAGNOSIS — W19XXXA Unspecified fall, initial encounter: Secondary | ICD-10-CM

## 2015-03-01 DIAGNOSIS — W010XXA Fall on same level from slipping, tripping and stumbling without subsequent striking against object, initial encounter: Secondary | ICD-10-CM | POA: Diagnosis not present

## 2015-03-01 DIAGNOSIS — Y998 Other external cause status: Secondary | ICD-10-CM | POA: Diagnosis not present

## 2015-03-01 MED ORDER — TETANUS-DIPHTHERIA TOXOIDS TD 5-2 LFU IM INJ
0.5000 mL | INJECTION | Freq: Once | INTRAMUSCULAR | Status: AC
  Administered 2015-03-01: 0.5 mL via INTRAMUSCULAR

## 2015-03-01 MED ORDER — TETANUS-DIPHTHERIA TOXOIDS TD 5-2 LFU IM INJ
INJECTION | INTRAMUSCULAR | Status: AC
  Administered 2015-03-01: 0.5 mL via INTRAMUSCULAR
  Filled 2015-03-01: qty 0.5

## 2015-03-01 NOTE — ED Provider Notes (Signed)
[facility] Emergency Department Provider Note  ____________________________________________  Time seen: Approximately 7:39 PM  I have reviewed the triage vital signs and the nursing notes.   HISTORY  Chief Complaint Fall    HPI Mervin Horizon Nijjar is a 81 y.o. female with a history of arthritis, osteoporosis and ambulatory dysfunction and walks at her baseline with a walker who presents with a fall while walking in her room tonight. She says she was walking with her walker when she slipped on the ground falling forward into the radiator. She did not lose consciousness. She says she does have some mild pain to the right hip which is chronic. She did sustain a bruise to the left side of her forehead with some swelling. She denies any pain in the neck. No preceding chest pain, dizziness or shortness of breath. Says has a history of falls. No nausea.Does not know when her last tetanus shot.  Past Medical History  Diagnosis Date  . Arthritis     There are no active problems to display for this patient.   Past Surgical History  Procedure Laterality Date  . Wrist surgery      pt states "abourt six months ago"    Current Outpatient Rx  Name  Route  Sig  Dispense  Refill  . OVER THE COUNTER MEDICATION   Nasal   Place 1 spray into the nose daily. Pt states using OTC nasal spray for congestion; unknown drug           Allergies Review of patient's allergies indicates no known allergies.  No family history on file.  Social History History  Substance Use Topics  . Smoking status: Never Smoker   . Smokeless tobacco: Never Used  . Alcohol Use: No    Review of Systems Constitutional: No fever/chills Eyes: No visual changes. ENT: No sore throat. Cardiovascular: Denies chest pain. Respiratory: Denies shortness of breath. Gastrointestinal: No abdominal pain.  No nausea, no vomiting.  No diarrhea.  No constipation. Genitourinary: Negative for  dysuria. Musculoskeletal: Negative for back pain. Skin: Negative for rash. Neurological: Negative for headaches, focal weakness or numbness.  10-point ROS otherwise negative.  ____________________________________________   PHYSICAL EXAM:  VITAL SIGNS: ED Triage Vitals  Enc Vitals Group     BP --      Pulse --      Resp --      Temp --      Temp src --      SpO2 --      Weight --      Height --      Head Cir --      Peak Flow --      Pain Score --      Pain Loc --      Pain Edu? --      Excl. in GC? --     Constitutional: Alert and oriented. Well appearing and in no acute distress. Eyes: Conjunctivae are normal. PERRL. EOMI. Head: Left-sided hematoma to the forehead which measures about 2 x 4 cm. There is a small area of abrasion overlying this hematoma. There is no depressed skull fracture or bogginess. Nose: No congestion/rhinnorhea. Abrasion to the left philtrum which the patient says is chronic and not related to the fall. Mouth/Throat: Mucous membranes are moist.  Oropharynx non-erythematous. Neck: No stridor.   Cardiovascular: Normal rate, regular rhythm. Grossly normal heart sounds.  Good peripheral circulation. Respiratory: Normal respiratory effort.  No retractions. Lungs CTAB. Gastrointestinal:  Soft and nontender. No distention. No abdominal bruits. No CVA tenderness. Musculoskeletal: No lower extremity tenderness nor edema.  No joint effusions. Pelvis is stable without any tenderness to palpation. Patient fully ranges her bilateral lower extremities with full strength and without any pain. Neurologic:  Normal speech and language. No gross focal neurologic deficits are appreciated. Speech is normal. No gait instability. Skin:  Skin is warm, dry and intact. No rash noted. Psychiatric: Mood and affect are normal. Speech and behavior are normal.  ____________________________________________   LABS (all labs ordered are listed, but only abnormal results are  displayed)  Labs Reviewed - No data to display ____________________________________________  EKG   ____________________________________________  RADIOLOGY CT brain with soft tissue hematoma overlying the left frontal calvarium.  No acute findings on the chest x-ray. Possible right inferior pubic rami fracture on the pelvis x-ray. I personally viewed these images.  ____________________________________________   PROCEDURES   ____________________________________________   INITIAL IMPRESSION / ASSESSMENT AND PLAN / ED COURSE  Pertinent labs & imaging results that were available during my care of the patient were reviewed by me and considered in my medical decision making (see chart for details).  ----------------------------------------- 8:55 PM on 03/01/2015 -----------------------------------------  Put firm pressure on the inferior pubic rami on the right inpatient without any pain. Feel that this is not likely a new fracture. Discussed with Dr. Leda Quail of radiology and says it is quite possible this could be osteopenia. Patient says she feels fine without any new pain to the pelvis. We'll discharge back to home. ____________________________________________   FINAL CLINICAL IMPRESSION(S) / ED DIAGNOSES  Acute mechanical fall. Acute left frontal hematoma. Initial visit.    Myrna Blazer, MD 03/01/15 [phone]

## 2015-03-01 NOTE — ED Notes (Signed)
AAOx3.  Skin warm and dry.  Patient states she lost balance and fell and hit head on the con=rner of the air conditioner.  Patient states she has know balance problems since right mastoid was removed at age 81.  Denies LOC.

## 2015-03-01 NOTE — ED Notes (Signed)
AAOx3.  Skin warm and dry.  Ambulated to BR with minimal assistance.  Patient tolerated well. D/C home with EMS

## 2015-03-01 NOTE — ED Notes (Signed)
AAOx3. Anxious about returning home.  Emotional support given with minimal effect.  [facility] called and notified of discharge.  No transport available after 1700.  [facility] called for transport.  Patient informed.

## 2015-04-21 DIAGNOSIS — S79912A Unspecified injury of left hip, initial encounter: Secondary | ICD-10-CM | POA: Diagnosis present

## 2015-04-21 DIAGNOSIS — Y998 Other external cause status: Secondary | ICD-10-CM | POA: Insufficient documentation

## 2015-04-21 DIAGNOSIS — Y92129 Unspecified place in nursing home as the place of occurrence of the external cause: Secondary | ICD-10-CM | POA: Diagnosis not present

## 2015-04-21 DIAGNOSIS — S32592A Other specified fracture of left pubis, initial encounter for closed fracture: Secondary | ICD-10-CM | POA: Diagnosis not present

## 2015-04-21 DIAGNOSIS — Z79899 Other long term (current) drug therapy: Secondary | ICD-10-CM | POA: Diagnosis not present

## 2015-04-21 DIAGNOSIS — Z79891 Long term (current) use of opiate analgesic: Secondary | ICD-10-CM | POA: Diagnosis not present

## 2015-04-21 DIAGNOSIS — Y9389 Activity, other specified: Secondary | ICD-10-CM | POA: Diagnosis not present

## 2015-04-21 DIAGNOSIS — W1839XA Other fall on same level, initial encounter: Secondary | ICD-10-CM | POA: Insufficient documentation

## 2015-04-22 ENCOUNTER — Emergency Department: Payer: Medicare Other

## 2015-04-22 ENCOUNTER — Emergency Department
Admission: EM | Admit: 2015-04-22 | Discharge: 2015-04-22 | Disposition: A | Discharge status: Home / Self Care | Payer: Medicare Other | Attending: Emergency Medicine | Admitting: Emergency Medicine

## 2015-04-22 ENCOUNTER — Encounter: Payer: Self-pay | Admitting: Occupational Medicine

## 2015-04-22 DIAGNOSIS — S329XXA Fracture of unspecified parts of lumbosacral spine and pelvis, initial encounter for closed fracture: Secondary | ICD-10-CM

## 2015-04-22 DIAGNOSIS — S32592A Other specified fracture of left pubis, initial encounter for closed fracture: Secondary | ICD-10-CM | POA: Diagnosis not present

## 2015-04-22 DIAGNOSIS — M25552 Pain in left hip: Secondary | ICD-10-CM

## 2015-04-22 HISTORY — DX: Weakness: R53.1

## 2015-04-22 HISTORY — DX: Repeated falls: R29.6

## 2015-04-22 HISTORY — DX: Personal history of other specified conditions: Z87.898

## 2015-04-22 LAB — COMPREHENSIVE METABOLIC PANEL WITH GFR
ALT: 23 U/L (ref 14–54)
AST: 38 U/L (ref 15–41)
Albumin: 3.2 g/dL — ABNORMAL LOW (ref 3.5–5.0)
Alkaline Phosphatase: 104 U/L (ref 38–126)
Anion gap: 10 (ref 5–15)
BUN: 28 mg/dL — ABNORMAL HIGH (ref 6–20)
CO2: 29 mmol/L (ref 22–32)
Calcium: 8.9 mg/dL (ref 8.9–10.3)
Chloride: 99 mmol/L — ABNORMAL LOW (ref 101–111)
Creatinine, Ser: 0.83 mg/dL (ref 0.44–1.00)
GFR calc Af Amer: >60 mL/min
GFR calc non Af Amer: 60 mL/min — ABNORMAL LOW
Glucose, Bld: 134 mg/dL — ABNORMAL HIGH (ref 65–99)
Potassium: 4 mmol/L (ref 3.5–5.1)
Sodium: 138 mmol/L (ref 135–145)
Total Bilirubin: 0.7 mg/dL (ref 0.3–1.2)
Total Protein: 7.3 g/dL (ref 6.5–8.1)

## 2015-04-22 LAB — CBC
HCT: 38.6 % (ref 35.0–47.0)
Hemoglobin: 12.7 g/dL (ref 12.0–16.0)
MCH: 31.1 pg (ref 26.0–34.0)
MCHC: 32.8 g/dL (ref 32.0–36.0)
MCV: 94.8 fL (ref 80.0–100.0)
PLATELETS: 206 10*3/uL (ref 150–440)
RBC: 4.07 MIL/uL (ref 3.80–5.20)
RDW: 14.7 % — AB (ref 11.5–14.5)
WBC: 7.5 10*3/uL (ref 3.6–11.0)

## 2015-04-22 LAB — PROTIME-INR
INR: 1.03
Prothrombin Time: 13.7 seconds (ref 11.4–15.0)

## 2015-04-22 MED ORDER — MORPHINE SULFATE 2 MG/ML IJ SOLN
2.0000 mg | Freq: Once | INTRAMUSCULAR | Status: AC
  Administered 2015-04-22: 2 mg via INTRAVENOUS
  Filled 2015-04-22: qty 1

## 2015-04-22 MED ORDER — ONDANSETRON HCL 4 MG/2ML IJ SOLN
4.0000 mg | Freq: Once | INTRAMUSCULAR | Status: AC
  Administered 2015-04-22: 4 mg via INTRAVENOUS
  Filled 2015-04-22: qty 2

## 2015-04-22 NOTE — ED Notes (Signed)
Ptp  [facility] pt had unwitness fall today around 8pm family didn't wont her sent out. But she continued to have severe hip pain left leg. Denies loc or hitting head. Per ems staff stood pt very painful. Pt uses a rolling walking hx of unsteady gait. Vitals stable per ems except could get o2 level due to pts cold fingers. Pt noted to have congested cough as well.

## 2015-04-22 NOTE — Discharge Instructions (Signed)
Stable Pelvic Fracture °You have one or more fractures (this means there is a break in the bones) of the pelvis. The pelvis is the ring of bones that make up your hipbones. These are the bones you sit on and the lower part of the spine. It is like a boney ring where your legs attach and which supports your upper body. You have an undisplaced fracture. This means the bones are in good position. The pelvic fracture you have is a simple (uncomplicated) fracture. °DIAGNOSIS  °X-rays usually diagnose these fractures. °TREATMENT  °The goal of treating pelvic fractures is to get the bones to heal in a good position. The patient should return to normal activities as soon as possible. Such fractures are often treated with normal bed rest and conservative measures.  °HOME CARE INSTRUCTIONS  °· You should be on bed rest for as long as directed by your caregiver. Change positions of your legs every 1-2 hours to maintain good blood flow. You may sit as long as is tolerable. Following this, you may do usual activities, but avoid strenuous activities for as long as directed by your caregiver. °· Only take over-the-counter or prescription medicines for pain, discomfort, or fever as directed by your caregiver. °· Bed rest may also be used for discomfort. °· Resume your activities when you are able. Use a cane or crutch on the injured side to reduce pain while walking, as needed. °· If you develop increased pain or discomfort not relieved with medications, contact your caregiver. °· Warning: Do not drive a car or operate a motor vehicle until your caregiver specifically tells you it is safe to do so. °SEEK IMMEDIATE MEDICAL CARE IF:  °· You feel light-headed or faint, develop chest pain or shortness of breath. °· An unexplained oral temperature above 102° F (38.9° C) develops. °· You develop blood in the urine or in the stools. °· There is difficulty urinating, and/or having a bowel movement, or pain with these efforts. °· There is a  difficulty or increased pain with walking. °· There is swelling in one or both legs that is not normal. °Document Released: 11/05/2001 Document Revised: 01/11/2014 Document Reviewed: 04/09/2008 °ExitCare® Patient Information ©2015 [employer]. This information is not intended to replace advice given to you by your health care provider. Make sure you discuss any questions you have with your health care provider. ° °

## 2015-04-22 NOTE — ED Provider Notes (Signed)
[facility] Emergency Department Provider Note  ____________________________________________  Time seen: 12:15 AM  I have reviewed the triage vital signs and the nursing notes.   HISTORY  Chief Complaint Fall and Hip Pain      HPI Anna Moss is a 81 y.o. female presents from [facility] with a history of an unwitnessed fall today at 8 PM. EMS stated that the family did not 1 the patient initially transferred to the emergency department. Patient now presents with complaint of left hip pain per EMS patient had considerable pain with standing     Past Medical History  Diagnosis Date  . Arthritis   . Weakness   . History of unsteady gait   . Falls frequently     There are no active problems to display for this patient.   Past Surgical History  Procedure Laterality Date  . Wrist surgery      pt states "abourt six months ago"    Current Outpatient Rx  Name  Route  Sig  Dispense  Refill  . acetaminophen (TYLENOL) 325 MG tablet   Oral   Take 650 mg by mouth every 4 (four) hours as needed for mild pain or fever.         Marland Kitchen albuterol (PROVENTIL HFA;VENTOLIN HFA) 108 (90 BASE) MCG/ACT inhaler   Inhalation   Inhale 2 puffs into the lungs every 6 (six) hours as needed for wheezing or shortness of breath.         . Amino Acids-Protein Hydrolys (FEEDING SUPPLEMENT, PRO-STAT SUGAR FREE 64,) LIQD   Oral   Take 30 mLs by mouth 3 (three) times daily with meals.         . Calcium Carbonate-Vitamin D (CALCIUM 600+D) 600-400 MG-UNIT per tablet   Oral   Take 1 tablet by mouth daily.         Marland Kitchen docusate sodium (COLACE) 100 MG capsule   Oral   Take 100 mg by mouth 2 (two) times daily.         . feeding supplement, ENSURE ENLIVE, (ENSURE ENLIVE) LIQD   Oral   Take 237 mLs by mouth 3 (three) times daily between meals.         Marland Kitchen guaifenesin (ROBITUSSIN) 100 MG/5ML syrup   Oral   Take 200 mg by mouth 3 (three) times  daily as needed for cough.         . loratadine (CLARITIN) 10 MG tablet   Oral   Take 10 mg by mouth.         . Melatonin 3 MG TABS   Oral   Take 1 tablet by mouth at bedtime.         . montelukast (SINGULAIR) 10 MG tablet   Oral   Take 10 mg by mouth at bedtime.         . Multiple Vitamins-Minerals (CENTRUM SILVER PO)   Oral   Take 1 tablet by mouth daily.         . risedronate (ACTONEL) 35 MG tablet   Oral   Take 35 mg by mouth every 7 (seven) days. with water on empty stomach, nothing by mouth or lie down for next 30 minutes.         . senna (SENOKOT) 8.6 MG tablet   Oral   Take 1 tablet by mouth at bedtime.         . sodium chloride (OCEAN) 0.65 % SOLN nasal spray  Each Nare   Place 1 spray into both nostrils as needed for congestion.         . traMADol (ULTRAM) 50 MG tablet   Oral   Take 50 mg by mouth 3 (three) times daily.           Allergies Review of patient's allergies indicates no known allergies.  No family history on file.  Social History Social History  Substance Use Topics  . Smoking status: Never Smoker   . Smokeless tobacco: Never Used  . Alcohol Use: No    Review of Systems  Constitutional: Negative for fever. Eyes: Negative for visual changes. ENT: Negative for sore throat. Cardiovascular: Negative for chest pain. Respiratory: Negative for shortness of breath. Gastrointestinal: Negative for abdominal pain, vomiting and diarrhea. Genitourinary: Negative for dysuria. Musculoskeletal: Negative for back pain.positive for left hip pain  Skin: Negative for rash. Neurological: Negative for headaches, focal weakness or numbness.  10-point ROS otherwise negative.  ____________________________________________   PHYSICAL EXAM:  VITAL SIGNS: ED Triage Vitals  Enc Vitals Group     BP 04/22/15 0012 152/77 mmHg     Pulse Rate 04/22/15 0012 106     Resp 04/22/15 0012 22     Temp 04/22/15 0012 98 F (36.7 C)     Temp  Source 04/22/15 0012 Oral     SpO2 04/22/15 0012 72 %     Weight 04/22/15 0012 106 lb 11.2 oz (48.399 kg)     Height 04/22/15 0012 5' (1.524 m)     Head Cir --      Peak Flow --      Pain Score 04/22/15 0044 8     Pain Loc --      Pain Edu? --      Excl. in GC? --      Constitutional: Alert and oriented. Well appearing and in no distress. Eyes: Conjunctivae are normal. PERRL. Normal extraocular movements. ENT   Head: Normocephalic and atraumatic.   Nose: No congestion/rhinnorhea.   Mouth/Throat: Mucous membranes are moist.   Neck: No stridor.. Cardiovascular: Normal rate, regular rhythm. Normal and symmetric distal pulses are present in all extremities. No murmurs, rubs, or gallops. Respiratory: Normal respiratory effort without tachypnea nor retractions. Breath sounds are clear and equal bilaterally. No wheezes/rales/rhonchi. Gastrointestinal: Soft and nontender. No distention. There is no CVA tenderness. Genitourinary: deferred Musculoskeletal:tenderness palpation of the left hip Neurologic:  Normal speech and language. No gross focal neurologic deficits are appreciated. Speech is normal.  Skin:  Skin is warm, dry and intact. No rash noted. Psychiatric: Mood and affect are normal. Speech and behavior are normal. Patient exhibits appropriate insight and judgment.  ____________________________________________    LABS (pertinent positives/negatives)  Labs Reviewed  CBC - Abnormal; Notable for the following:    RDW 14.7 (*)    All other components within normal limits  COMPREHENSIVE METABOLIC PANEL - Abnormal; Notable for the following:    Chloride 99 (*)    Glucose, Bld 134 (*)    BUN 28 (*)    Albumin 3.2 (*)    GFR calc non Af Amer 60 (*)    All other components within normal limits  PROTIME-INR        RADIOLOGY  Left hip x-ray revealed  calcification.  IMPRESSION: Question recent fracture of the medial superior pubic ramus on the left. Old right  ischial fracture. There is postoperative change in both proximal femurs with healing of intertrochanteric fractures. No acute proximal femur fracture on  either side. No dislocations. Areas of atherosclerotic calcification.   Electronically Signed By: Bretta Bang III M.D.    INITIAL IMPRESSION / ASSESSMENT AND PLAN / ED COURSE  Pertinent labs & imaging results that were available during my care of the patient were reviewed by me and considered in my medical decision making (see chart for details).   patient with a left pubic rami fracture. Patient's son was notified of all clinical findings via the RN. [facility] was also notified patient's son requested that the patient be transferred back from [facility].________________________________________   FINAL CLINICAL IMPRESSION(S) / ED DIAGNOSES  Final diagnoses:  Pelvic fracture, closed, initial encounter      Darci Current, MD 04/22/15 [phone]

## 2015-04-22 NOTE — ED Notes (Addendum)
Pt spoke with pt's son Theresia Bough per Dr. Manson Passey to inform him that pt had lef pubic ramus fracture. And that pt can bear weight as tolerated. Pt also will need physical therapy. Son agreed and said he follow up with the facility today and arrange for physical therapy. Report also given to [facility] med tech at [facility] about pt status. She agreed to pt coming back to the facility.

## 2015-08-08 ENCOUNTER — Other Ambulatory Visit
Admission: RE | Admit: 2015-08-08 | Discharge: 2015-08-08 | Disposition: A | Discharge status: Home / Self Care | Payer: Medicare Other | Source: Ambulatory Visit | Attending: Nurse Practitioner | Admitting: Nurse Practitioner

## 2015-08-08 DIAGNOSIS — R300 Dysuria: Secondary | ICD-10-CM | POA: Diagnosis present

## 2015-08-08 LAB — URINALYSIS COMPLETE WITH MICROSCOPIC (ARMC ONLY)
Bilirubin Urine: NEGATIVE
Glucose, UA: NEGATIVE mg/dL
Ketones, ur: NEGATIVE mg/dL
NITRITE: POSITIVE — AB
PH: 6.5 (ref 5.0–8.0)
Protein, ur: 30 mg/dL — AB
Specific Gravity, Urine: 1.015 (ref 1.005–1.030)
Squamous Epithelial / LPF: NONE SEEN

## 2015-08-10 LAB — URINE CULTURE: Culture: >=100000

## 2015-08-14 ENCOUNTER — Encounter: Payer: Self-pay | Admitting: Emergency Medicine

## 2015-08-14 ENCOUNTER — Emergency Department
Admission: EM | Admit: 2015-08-14 | Discharge: 2015-08-14 | Disposition: A | Discharge status: Home / Self Care | Payer: Medicare Other | Attending: Emergency Medicine | Admitting: Emergency Medicine

## 2015-08-14 DIAGNOSIS — S81811A Laceration without foreign body, right lower leg, initial encounter: Secondary | ICD-10-CM | POA: Diagnosis not present

## 2015-08-14 DIAGNOSIS — S51011A Laceration without foreign body of right elbow, initial encounter: Secondary | ICD-10-CM | POA: Insufficient documentation

## 2015-08-14 DIAGNOSIS — Y9289 Other specified places as the place of occurrence of the external cause: Secondary | ICD-10-CM | POA: Insufficient documentation

## 2015-08-14 DIAGNOSIS — Z79899 Other long term (current) drug therapy: Secondary | ICD-10-CM | POA: Diagnosis not present

## 2015-08-14 DIAGNOSIS — W1839XA Other fall on same level, initial encounter: Secondary | ICD-10-CM | POA: Insufficient documentation

## 2015-08-14 DIAGNOSIS — Y998 Other external cause status: Secondary | ICD-10-CM | POA: Diagnosis not present

## 2015-08-14 DIAGNOSIS — S51812A Laceration without foreign body of left forearm, initial encounter: Secondary | ICD-10-CM | POA: Insufficient documentation

## 2015-08-14 DIAGNOSIS — S81812A Laceration without foreign body, left lower leg, initial encounter: Secondary | ICD-10-CM | POA: Diagnosis not present

## 2015-08-14 DIAGNOSIS — T148XXA Other injury of unspecified body region, initial encounter: Secondary | ICD-10-CM

## 2015-08-14 DIAGNOSIS — S81012A Laceration without foreign body, left knee, initial encounter: Secondary | ICD-10-CM | POA: Insufficient documentation

## 2015-08-14 DIAGNOSIS — Y9389 Activity, other specified: Secondary | ICD-10-CM | POA: Diagnosis not present

## 2015-08-14 NOTE — ED Provider Notes (Signed)
Time Seen: Approximately 0 5:30 I have reviewed the triage notes  Chief Complaint: Fall   History of Present Illness: Anna Moss is a 81 y.o. female who had a non-syncopal fall this morning. Patient normally is in a wheelchair during the day and only transitions. She apparently was getting out of bed and slid to the floor. He primarily on her buttock area. She's had multiple skin tears present none of severe size. Patient denies any head trauma. She denies any chest or back pain.  Patient arrives from [facility] assisted living area Past Medical History  Diagnosis Date  . Arthritis   . Weakness   . History of unsteady gait   . Falls frequently     There are no active problems to display for this patient.   Past Surgical History  Procedure Laterality Date  . Wrist surgery      pt states "abourt six months ago"    Past Surgical History  Procedure Laterality Date  . Wrist surgery      pt states "abourt six months ago"    Current Outpatient Rx  Name  Route  Sig  Dispense  Refill  . acetaminophen (TYLENOL) 325 MG tablet   Oral   Take 650 mg by mouth every 4 (four) hours as needed for mild pain or fever.         Marland Kitchen albuterol (PROVENTIL HFA;VENTOLIN HFA) 108 (90 BASE) MCG/ACT inhaler   Inhalation   Inhale 2 puffs into the lungs every 6 (six) hours as needed for wheezing or shortness of breath.         . Amino Acids-Protein Hydrolys (FEEDING SUPPLEMENT, PRO-STAT SUGAR FREE 64,) LIQD   Oral   Take 30 mLs by mouth 3 (three) times daily with meals.         . Calcium Carbonate-Vitamin D (CALCIUM 600+D) 600-400 MG-UNIT per tablet   Oral   Take 1 tablet by mouth daily.         Marland Kitchen docusate sodium (COLACE) 100 MG capsule   Oral   Take 100 mg by mouth 2 (two) times daily.         . feeding supplement, ENSURE ENLIVE, (ENSURE ENLIVE) LIQD   Oral   Take 237 mLs by mouth 3 (three) times daily between meals.         Marland Kitchen guaifenesin (ROBITUSSIN) 100 MG/5ML  syrup   Oral   Take 200 mg by mouth 3 (three) times daily as needed for cough.         . loratadine (CLARITIN) 10 MG tablet   Oral   Take 10 mg by mouth.         . Melatonin 3 MG TABS   Oral   Take 1 tablet by mouth at bedtime.         . montelukast (SINGULAIR) 10 MG tablet   Oral   Take 10 mg by mouth at bedtime.         . Multiple Vitamins-Minerals (CENTRUM SILVER PO)   Oral   Take 1 tablet by mouth daily.         . risedronate (ACTONEL) 35 MG tablet   Oral   Take 35 mg by mouth every 7 (seven) days. with water on empty stomach, nothing by mouth or lie down for next 30 minutes.         . senna (SENOKOT) 8.6 MG tablet   Oral   Take 1 tablet by mouth at bedtime.         Marland Kitchen  sodium chloride (OCEAN) 0.65 % SOLN nasal spray   Each Nare   Place 1 spray into both nostrils as needed for congestion.         . traMADol (ULTRAM) 50 MG tablet   Oral   Take 50 mg by mouth 3 (three) times daily.           Allergies:  Review of patient's allergies indicates no known allergies.  Family History: History reviewed. No pertinent family history.  Social History: Social History  Substance Use Topics  . Smoking status: Never Smoker   . Smokeless tobacco: Never Used  . Alcohol Use: No     Review of Systems:   10 point review of systems was performed and was otherwise negative:  Constitutional: No fever Eyes: No visual disturbances ENT: No sore throat, ear pain Cardiac: No chest pain Respiratory: No shortness of breath, wheezing, or stridor Abdomen: No abdominal pain, no vomiting, No diarrhea Endocrine: No weight loss, No night sweats Extremities: No peripheral edema, cyanosis Skin: No rashes, easy bruising Neurologic: No focal weakness, trouble with speech or swollowing Urologic: No dysuria, Hematuria, or urinary frequency   Physical Exam:  ED Triage Vitals  Enc Vitals Group     BP 08/14/15 0520 139/88 mmHg     Pulse Rate 08/14/15 0520 80     Resp  08/14/15 0520 16     Temp 08/14/15 0520 97.6 F (36.4 C)     Temp Source 08/14/15 0520 Oral     SpO2 08/14/15 0520 98 %     Weight 08/14/15 0520 105 lb (47.628 kg)     Height 08/14/15 0520 5' (1.524 m)     Head Cir --      Peak Flow --      Pain Score 08/14/15 0521 8     Pain Loc --      Pain Edu? --      Excl. in GC? --     General: Awake , Alert , and Oriented times 3; GCS 15 Head: Normal cephalic , atraumatic Eyes: Pupils equal , round, reactive to light Nose/Throat: No nasal drainage, patent upper airway without erythema or exudate.  Neck: Supple, Full range of motion, No anterior adenopathy or palpable thyroid masses Lungs: Clear to ascultation without wheezes , rhonchi, or rales Heart: Regular rate, regular rhythm without murmurs , gallops , or rubs Abdomen: Soft, non tender without rebound, guarding , or rigidity; bowel sounds positive and symmetric in all 4 quadrants. No organomegaly .        Extremities: 2 plus symmetric pulses. No edema, clubbing or cyanosis. Patient has full range of motion of both upper and lower extremities without any focal pain other than the skin tears with no significant joint pain at the shoulders, hips, knees, etc. Normal bony alignment Neurologic: , Motor symmetric without deficits, sensory intact Skin: Patient has multiple skin tears across her left forearm and left knee right lower leg and left lower leg (see nurse's note) Patient has no reproducible thoracic or lumbar spine pain.    ED Course:  Patient's wounds were all cleaned and dressed with Xeroform dressing. Patient does not appear to suffer any significant bony injury from her fall. She has full range of motion of her hips without any reproducible pain. She has no discomfort with palpation across the thoracic or lumbar spine. Pain is exclusively in the areas of her skin tears. Patient was discharged with instructions on skin tear and did not have any lesions  that were  suturable.    Assessment:  Status post fall with multiple skin tears   Final Clinical Impression:   Final diagnoses:  Multiple skin tears     Plan: * Outpatient management Patient was advised to return immediately if condition worsens. Patient was advised to follow up with their primary care physician or other specialized physicians involved in their outpatient care            Jennye Moccasin, MD 08/14/15 [phone]

## 2015-08-14 NOTE — ED Notes (Signed)
Secretaries assisted and found another number for [facility], but goes to voicemail as well.  Still unable to contact any staff members.

## 2015-08-14 NOTE — ED Notes (Signed)
EMS arrived to transport.

## 2015-08-14 NOTE — ED Notes (Addendum)
Pt's skin tears irrigated with sterile saline, covered with xerofoam dressing, and covered with gauze.  Pt tolerated procedure well. Procedure performed per dr. Huel Cotequigley instructions

## 2015-08-14 NOTE — ED Notes (Signed)
[facility] to [facility] two different [facility].  No one picked up for [phone], and directed to voicemail when [phone].  Will attempt again later.

## 2015-08-14 NOTE — ED Notes (Signed)
[facility] rm [facility]  EMS reports fall, pt reports she was getting out of bed and slid to floor.  Pt reports she fell on her bottom, pain to sacral area.  Many skin tears present, 1" on right elbow, 1" on left forearm, 2" x .5" on left knee, 3" and 1" tears on right lower leg, and large area torn on left lower leg, approx 4" x 2".  Pt w/ hx of falls.  Pt NAD at this time.

## 2015-08-14 NOTE — ED Notes (Signed)
[facility], report given to facility. Pt awaiting transport at this time.

## 2016-03-15 ENCOUNTER — Encounter: Payer: Self-pay | Admitting: Emergency Medicine

## 2016-03-15 ENCOUNTER — Emergency Department: Payer: Medicare Other

## 2016-03-15 ENCOUNTER — Emergency Department
Admission: EM | Admit: 2016-03-15 | Discharge: 2016-03-15 | Disposition: A | Discharge status: Home / Self Care | Payer: Medicare Other | Attending: Emergency Medicine | Admitting: Emergency Medicine

## 2016-03-15 DIAGNOSIS — M79672 Pain in left foot: Secondary | ICD-10-CM | POA: Diagnosis not present

## 2016-03-15 DIAGNOSIS — Y9389 Activity, other specified: Secondary | ICD-10-CM | POA: Insufficient documentation

## 2016-03-15 DIAGNOSIS — Z79899 Other long term (current) drug therapy: Secondary | ICD-10-CM | POA: Diagnosis not present

## 2016-03-15 DIAGNOSIS — Y929 Unspecified place or not applicable: Secondary | ICD-10-CM | POA: Insufficient documentation

## 2016-03-15 DIAGNOSIS — M25552 Pain in left hip: Secondary | ICD-10-CM | POA: Insufficient documentation

## 2016-03-15 DIAGNOSIS — W1839XA Other fall on same level, initial encounter: Secondary | ICD-10-CM | POA: Insufficient documentation

## 2016-03-15 DIAGNOSIS — S0990XA Unspecified injury of head, initial encounter: Secondary | ICD-10-CM | POA: Diagnosis present

## 2016-03-15 DIAGNOSIS — Y999 Unspecified external cause status: Secondary | ICD-10-CM | POA: Insufficient documentation

## 2016-03-15 DIAGNOSIS — W19XXXA Unspecified fall, initial encounter: Secondary | ICD-10-CM

## 2016-03-15 DIAGNOSIS — M1990 Unspecified osteoarthritis, unspecified site: Secondary | ICD-10-CM | POA: Insufficient documentation

## 2016-03-15 DIAGNOSIS — R296 Repeated falls: Secondary | ICD-10-CM | POA: Diagnosis not present

## 2016-03-15 LAB — COMPREHENSIVE METABOLIC PANEL
ALBUMIN: 3.2 g/dL — AB (ref 3.5–5.0)
ALK PHOS: 87 U/L (ref 38–126)
ALT: 23 U/L (ref 14–54)
ANION GAP: 6 (ref 5–15)
AST: 27 U/L (ref 15–41)
BUN: 37 mg/dL — ABNORMAL HIGH (ref 6–20)
CALCIUM: 9.2 mg/dL (ref 8.9–10.3)
CHLORIDE: 102 mmol/L (ref 101–111)
CO2: 33 mmol/L — AB (ref 22–32)
Creatinine, Ser: 0.88 mg/dL (ref 0.44–1.00)
GFR calc Af Amer: >60 mL/min (ref >60)
GFR calc non Af Amer: 55 mL/min — ABNORMAL LOW (ref >60)
GLUCOSE: 114 mg/dL — AB (ref 65–99)
Potassium: 4 mmol/L (ref 3.5–5.1)
SODIUM: 141 mmol/L (ref 135–145)
Total Bilirubin: 0.5 mg/dL (ref 0.3–1.2)
Total Protein: 7 g/dL (ref 6.5–8.1)

## 2016-03-15 LAB — CBC WITH DIFFERENTIAL/PLATELET
BASOS PCT: 0 %
Basophils Absolute: 0 10*3/uL (ref 0–0.1)
Eosinophils Absolute: 0.1 10*3/uL (ref 0–0.7)
Eosinophils Relative: 2 %
HEMATOCRIT: 38.4 % (ref 35.0–47.0)
Hemoglobin: 13.1 g/dL (ref 12.0–16.0)
LYMPHS ABS: 1.1 10*3/uL (ref 1.0–3.6)
Lymphocytes Relative: 19 %
MCH: 33.2 pg (ref 26.0–34.0)
MCHC: 34 g/dL (ref 32.0–36.0)
MCV: 97.5 fL (ref 80.0–100.0)
MONO ABS: 0.4 10*3/uL (ref 0.2–0.9)
MONOS PCT: 7 %
NEUTROS ABS: 4 10*3/uL (ref 1.4–6.5)
Neutrophils Relative %: 72 %
Platelets: 215 10*3/uL (ref 150–440)
RBC: 3.94 MIL/uL (ref 3.80–5.20)
RDW: 14 % (ref 11.5–14.5)
WBC: 5.6 10*3/uL (ref 3.6–11.0)

## 2016-03-15 NOTE — Discharge Instructions (Signed)

## 2016-03-15 NOTE — ED Notes (Signed)
EMS called for discharge transportation

## 2016-03-15 NOTE — NC FL2 (Signed)
[place] MEDICAID FL2 LEVEL OF CARE SCREENING TOOL     IDENTIFICATION  Patient Name: Anna CorporalSonya Kelly Uguccioni Birthdate: 03-May-1921 Sex: female Admission Date (Current Location): 03/15/2016  [place]   Facility and Address:  [facility], [address], [place], [place] [address]      Provider Number: [id number]  Attending Physician Name and Address:  Arnaldo NatalPaul F Malinda, MD  Relative Name and Phone Number:       Current Level of Care: Hospital Recommended Level of Care: Skilled Nursing Facility Prior Approval Number:    Date Approved/Denied:   PASRR Number: [id number]  Discharge Plan: SNF    Current Diagnoses: There are no active problems to display for this patient.   Orientation RESPIRATION BLADDER Height & Weight     Self, Situation      Weight: 90 lb (40.824 kg) Height:     BEHAVIORAL SYMPTOMS/MOOD NEUROLOGICAL BOWEL NUTRITION STATUS   (None)        AMBULATORY STATUS COMMUNICATION OF NEEDS Skin   Extensive Assist                           Personal Care Assistance Level of Assistance  Bathing, Feeding, Dressing Bathing Assistance: Limited assistance Feeding assistance: Independent Dressing Assistance: Limited assistance     Functional Limitations Info  Sight, Hearing, Speech Sight Info: Adequate Hearing Info: Adequate Speech Info: Adequate    SPECIAL CARE FACTORS FREQUENCY                       Contractures      Additional Factors Info                  Current Medications (03/15/2016):  This is the current hospital active medication list No current facility-administered medications for this encounter.   Current Outpatient Prescriptions  Medication Sig Dispense Refill  . acetaminophen (TYLENOL) 500 MG tablet Take 500 mg by mouth 3 (three) times daily. Give between tramadol doses (do not wake patient up)    . Amino Acids-Protein Hydrolys (FEEDING SUPPLEMENT, PRO-STAT SUGAR FREE  64,) LIQD Take 30 mLs by mouth 3 (three) times daily with meals.    . Calcium Carbonate-Vitamin D (CALCIUM 600+D) 600-400 MG-UNIT per tablet Take 1 tablet by mouth daily.    Marland Kitchen. docusate sodium (COLACE) 100 MG capsule Take 100 mg by mouth 2 (two) times daily.    Marland Kitchen. loratadine (CLARITIN) 10 MG tablet Take 10 mg by mouth daily.     . Melatonin 3 MG TABS Take 1 tablet by mouth at bedtime. Give 30 minutes before bedtime    . montelukast (SINGULAIR) 10 MG tablet Take 10 mg by mouth at bedtime.    . Multiple Vitamins-Minerals (CENTRUM SILVER PO) Take 1 tablet by mouth daily.    . risedronate (ACTONEL) 35 MG tablet Take 35 mg by mouth every 7 (seven) days. with water on empty stomach, nothing by mouth or lie down for next 30 minutes.    . senna (SENOKOT) 8.6 MG tablet Take 1 tablet by mouth at bedtime.    . sodium chloride (OCEAN) 0.65 % SOLN nasal spray Place 1 spray into both nostrils as needed for congestion.    . traMADol (ULTRAM) 50 MG tablet Take 25 mg by mouth 3 (three) times daily.        Discharge Medications: Please see discharge summary for a list of discharge medications.  Relevant  Imaging Results:  Relevant Lab Results:   Additional Information  SSN [id number]  Jonathon JordanLynn B Taimur Fier, LCSWA

## 2016-03-15 NOTE — ED Provider Notes (Signed)
[facility] Emergency Department Provider Note   ____________________________________________  Time seen: Approximately 4:10 PM  I have reviewed the triage vital signs and the nursing notes.   HISTORY  Chief Complaint Fall; Hip Pain; and Foot Injury    HPI Anna Moss is a 82 y.o. female from [facility] she reports she has history of frequent falls she often stands up and gets lightheaded and falls forward. Patient did this today. Patient reports she may have hit her head she is not sure. Patient complains of a lot of pain in her left hip. She is able to move her hip but palpation hurts. Patient also complains of chronic pain in the left heel on the lateral side of the calcaneus where she has a callus. Pain is moderately severe and the callus and the heel and the hip from surgery.  Past Medical History  Diagnosis Date  . Arthritis   . Weakness   . History of unsteady gait   . Falls frequently     There are no active problems to display for this patient.   Past Surgical History  Procedure Laterality Date  . Wrist surgery      pt states "abourt six months ago"    Current Outpatient Rx  Name  Route  Sig  Dispense  Refill  . acetaminophen (TYLENOL) 500 MG tablet   Oral   Take 500 mg by mouth 3 (three) times daily. Give between tramadol doses (do not wake patient up)         . Amino Acids-Protein Hydrolys (FEEDING SUPPLEMENT, PRO-STAT SUGAR FREE 64,) LIQD   Oral   Take 30 mLs by mouth 3 (three) times daily with meals.         . Calcium Carbonate-Vitamin D (CALCIUM 600+D) 600-400 MG-UNIT per tablet   Oral   Take 1 tablet by mouth daily.         Marland Kitchen. docusate sodium (COLACE) 100 MG capsule   Oral   Take 100 mg by mouth 2 (two) times daily.         Marland Kitchen. loratadine (CLARITIN) 10 MG tablet   Oral   Take 10 mg by mouth daily.          . Melatonin 3 MG TABS   Oral   Take 1 tablet by mouth at bedtime. Give 30 minutes before  bedtime         . montelukast (SINGULAIR) 10 MG tablet   Oral   Take 10 mg by mouth at bedtime.         . Multiple Vitamins-Minerals (CENTRUM SILVER PO)   Oral   Take 1 tablet by mouth daily.         . risedronate (ACTONEL) 35 MG tablet   Oral   Take 35 mg by mouth every 7 (seven) days. with water on empty stomach, nothing by mouth or lie down for next 30 minutes.         . senna (SENOKOT) 8.6 MG tablet   Oral   Take 1 tablet by mouth at bedtime.         . sodium chloride (OCEAN) 0.65 % SOLN nasal spray   Each Nare   Place 1 spray into both nostrils as needed for congestion.         . traMADol (ULTRAM) 50 MG tablet   Oral   Take 25 mg by mouth 3 (three) times daily.  Allergies Review of patient's allergies indicates no known allergies.  No family history on file.  Social History Social History  Substance Use Topics  . Smoking status: Never Smoker   . Smokeless tobacco: Never Used  . Alcohol Use: No    Review of Systems Constitutional: No fever/chills Eyes: No visual changes. ENT: No sore throat. Cardiovascular: Denies chest pain. Respiratory: Denies shortness of breath. Gastrointestinal: No abdominal pain.  No nausea, no vomiting.  No diarrhea.  No constipation. Genitourinary: Negative for dysuria. Musculoskeletal: Negative for back pain. Skin: Negative for rash. Neurological: Negative for headaches, focal weakness or numbness.  10-point ROS otherwise negative.  ____________________________________________   PHYSICAL EXAM:  VITAL SIGNS: ED Triage Vitals  Enc Vitals Group     BP 03/15/16 1555 131/68 mmHg     Pulse Rate 03/15/16 1555 84     Resp 03/15/16 1555 16     Temp 03/15/16 1555 97.6 F (36.4 C)     Temp Source 03/15/16 1555 Oral     SpO2 03/15/16 1555 85 %     Weight 03/15/16 1555 90 lb (40.824 kg)     Height --      Head Cir --      Peak Flow --      Pain Score --      Pain Loc --      Pain Edu? --      Excl.  in GC? --     Constitutional: Alert and oriented. Well appearing and in no acute distress. Eyes: Conjunctivae are normal. PERRL. EOMI. Head: Atraumatic.Patient does have some scabbing to the left side of the nose which appears to be old Nose: No congestion/rhinnorhea. Mouth/Throat: Mucous membranes are moist.  Oropharynx non-erythematous. Neck: No stridor.   Cardiovascular: Normal rate, regular rhythm. Grossly normal heart sounds.  Good peripheral circulation. Respiratory: Normal respiratory effort.  No retractions. Lungs CTAB. Gastrointestinal: Soft and nontender. No distention. No abdominal bruits. No CVA tenderness. Musculoskeletal: Left hip is tender to palpation. There is a callus on the lateral side left calcaneus which is also tender. Patient reports his pain in the heel keeps her up at night. No joint effusions. Neurologic:  Normal speech and language. No gross focal neurologic deficits are appreciated. No gait instability. Skin:  Skin is warm, dry and intact. No rash noted. Psychiatric: Mood and affect are normal. Speech and behavior are normal.  ____________________________________________   LABS (all labs ordered are listed, but only abnormal results are displayed)  Labs Reviewed  COMPREHENSIVE METABOLIC PANEL - Abnormal; Notable for the following:    CO2 33 (*)    Glucose, Bld 114 (*)    BUN 37 (*)    Albumin 3.2 (*)    GFR calc non Af Amer 55 (*)    All other components within normal limits  CBC WITH DIFFERENTIAL/PLATELET   ____________________________________________  EKG  EKG read and interpreted by me shows normal sinus rhythm rate of 75 normal axis no acute ST-T wave changes ____________________________________________  RADIOLOGY  CT of the head x-ray of the foot and hip ____________________________________________   PROCEDURES    Procedures    ____________________________________________   INITIAL IMPRESSION / ASSESSMENT AND PLAN / ED  COURSE  Pertinent labs & imaging results that were available during my care of the patient were reviewed by me and considered in my medical decision making (see chart for details).   ____________________________________________   FINAL CLINICAL IMPRESSION(S) / ED DIAGNOSES  Final diagnoses:  Fall, initial encounter  NEW MEDICATIONS STARTED DURING THIS VISIT:  New Prescriptions   No medications on file     Note:  This document was prepared using Dragon voice recognition software and may include unintentional dictation errors.    Arnaldo NatalPaul F Malinda, MD 03/15/16 [phone]

## 2016-03-15 NOTE — ED Notes (Signed)
Pt returned from CT °

## 2016-03-15 NOTE — NC FL2 (Signed)
[place] MEDICAID FL2 LEVEL OF CARE SCREENING TOOL     IDENTIFICATION  Patient Name: Anna CorporalSonya Kelly Uguccioni Birthdate: Mar 18, 1921 Sex: female Admission Date (Current Location): 08/14/2015  [place]   Facility and Address:  [facility], [address], [place], [place] [address]      Provider Number: [id number]  Attending Physician Name and Address:  No att. providers found  Relative Name and Phone Number:       Current Level of Care: Hospital Recommended Level of Care: Skilled Nursing Facility Prior Approval Number:    Date Approved/Denied:   PASRR Number: [id number]  Discharge Plan: SNF    Current Diagnoses: There are no active problems to display for this patient.   Orientation RESPIRATION BLADDER Height & Weight     Self, Situation  Normal Continent Weight: 105 lb (47.628 kg) Height:  5' (152.4 cm)  BEHAVIORAL SYMPTOMS/MOOD NEUROLOGICAL BOWEL NUTRITION STATUS   (None)  (None) Continent Diet (Heart)  AMBULATORY STATUS COMMUNICATION OF NEEDS Skin   Limited Assist Verbally Normal                       Personal Care Assistance Level of Assistance  Bathing, Feeding, Dressing Bathing Assistance: Limited assistance Feeding assistance: Independent Dressing Assistance: Limited assistance     Functional Limitations Info  Sight, Hearing, Speech Sight Info: Adequate Hearing Info: Adequate Speech Info: Adequate    SPECIAL CARE FACTORS FREQUENCY  PT (By licensed PT)     PT Frequency: 5              Contractures      Additional Factors Info  Code Status Code Status Info: Not on file             Current Medications (03/15/2016):  This is the current hospital active medication list No current facility-administered medications for this encounter.   Current Outpatient Prescriptions  Medication Sig Dispense Refill  . Amino Acids-Protein Hydrolys (FEEDING SUPPLEMENT, PRO-STAT SUGAR FREE 64,)  LIQD Take 30 mLs by mouth 3 (three) times daily with meals.    . Calcium Carbonate-Vitamin D (CALCIUM 600+D) 600-400 MG-UNIT per tablet Take 1 tablet by mouth daily.    Marland Kitchen. docusate sodium (COLACE) 100 MG capsule Take 100 mg by mouth 2 (two) times daily.    Marland Kitchen. loratadine (CLARITIN) 10 MG tablet Take 10 mg by mouth daily.     . Melatonin 3 MG TABS Take 1 tablet by mouth at bedtime. Give 30 minutes before bedtime    . montelukast (SINGULAIR) 10 MG tablet Take 10 mg by mouth at bedtime.    . Multiple Vitamins-Minerals (CENTRUM SILVER PO) Take 1 tablet by mouth daily.    . risedronate (ACTONEL) 35 MG tablet Take 35 mg by mouth every 7 (seven) days. with water on empty stomach, nothing by mouth or lie down for next 30 minutes.    . senna (SENOKOT) 8.6 MG tablet Take 1 tablet by mouth at bedtime.    . sodium chloride (OCEAN) 0.65 % SOLN nasal spray Place 1 spray into both nostrils as needed for congestion.    . traMADol (ULTRAM) 50 MG tablet Take 25 mg by mouth 3 (three) times daily.     Marland Kitchen. acetaminophen (TYLENOL) 500 MG tablet Take 500 mg by mouth 3 (three) times daily. Give between tramadol doses (do not wake patient up)       Discharge Medications: Please see discharge summary for a list of discharge  medications.  Relevant Imaging Results:  Relevant Lab Results:   Additional Information SSN [id number]  Jonathon JordanLynn B Vana Arif, LCSWA

## 2016-03-15 NOTE — ED Notes (Signed)
Report given to kristen at [facility], per kristen pt to be transported by ambulance

## 2016-03-15 NOTE — ED Notes (Signed)
Patient transported to CT 

## 2016-03-15 NOTE — ED Notes (Signed)
From [facility].  Fell getting up from chair--possible dizziness.  Has hx frequent falls when she stands.  Per ems oxygen 89% on ra, bp 127/67

## 2016-08-10 ENCOUNTER — Encounter: Payer: Self-pay | Admitting: *Deleted

## 2016-08-10 ENCOUNTER — Emergency Department
Admission: EM | Admit: 2016-08-10 | Discharge: 2016-08-10 | Disposition: A | Discharge status: Home / Self Care | Payer: Medicare Other | Attending: Student in an Organized Health Care Education/Training Program | Admitting: Student in an Organized Health Care Education/Training Program

## 2016-08-10 ENCOUNTER — Emergency Department: Payer: Medicare Other

## 2016-08-10 DIAGNOSIS — R05 Cough: Secondary | ICD-10-CM

## 2016-08-10 DIAGNOSIS — R0981 Nasal congestion: Secondary | ICD-10-CM

## 2016-08-10 DIAGNOSIS — R059 Cough, unspecified: Secondary | ICD-10-CM

## 2016-08-10 DIAGNOSIS — Z79899 Other long term (current) drug therapy: Secondary | ICD-10-CM | POA: Insufficient documentation

## 2016-08-10 MED ORDER — DOXYCYCLINE HYCLATE 50 MG PO CAPS: 100.0000 mg | ORAL_CAPSULE | Freq: Two times a day (BID) | ORAL | 0 refills | Status: AC

## 2016-08-10 MED ORDER — ALBUTEROL SULFATE HFA 108 (90 BASE) MCG/ACT IN AERS: 1.0000 | INHALATION_SPRAY | Freq: Four times a day (QID) | RESPIRATORY_TRACT | 2 refills | Status: AC | PRN

## 2016-08-10 MED ORDER — FLUTICASONE PROPIONATE 50 MCG/ACT NA SUSP: 2.0000 | Freq: Every day | NASAL | 0 refills | Status: AC

## 2016-08-10 NOTE — ED Triage Notes (Signed)
Pt here via EMS from [facility], son wanted pt sent to evaluated  for shortness of breath, pt denies shortness of breath, pt complains of nasal congestion and cough, pt denies any other problems, pt alert and oriented

## 2016-08-10 NOTE — ED Provider Notes (Signed)
[facility] Emergency Department Provider Note    First MD Initiated Contact with Patient 08/10/16 1617     (approximate)  I have reviewed the triage vital signs and the nursing notes.   HISTORY  Chief Complaint Nasal Congestion    HPI Anna Moss is a 82 y.o. female  from assisted living who presents with chief complaint of several weeks of phlegm and congestion. Patient denies any shortness of breath. Per EMS patient was brought at the request the patient's son due to report of shortness of breath. Patient does appear to have some underlying dementia but denies any chest pain, shortness of breath nausea or vomiting. No recent fevers. I called [facility] to confirm this history. She's been afebrile without any signs or symptoms of respiratory distress. She's not had any chest pain. She was complaining of congestion and phlegm. No other complaints or concerns from [facility] staff at [facility].   Past Medical History:  Diagnosis Date  . Arthritis   . Falls frequently   . History of unsteady gait   . Weakness    No family history on file. Past Surgical History:  Procedure Laterality Date  . WRIST SURGERY     pt states "abourt six months ago"   There are no active problems to display for this patient.     Prior to Admission medications   Medication Sig Start Date End Date Taking? Authorizing Provider  acetaminophen (TYLENOL) 500 MG tablet Take 500 mg by mouth 3 (three) times daily. Give between tramadol doses (do not wake patient up)    Historical Provider, MD  Amino Acids-Protein Hydrolys (FEEDING SUPPLEMENT, PRO-STAT SUGAR FREE 64,) LIQD Take 30 mLs by mouth 3 (three) times daily with meals.    Historical Provider, MD  Calcium Carbonate-Vitamin D (CALCIUM 600+D) 600-400 MG-UNIT per tablet Take 1 tablet by mouth daily.    Historical Provider, MD  docusate sodium (COLACE) 100 MG capsule Take 100 mg by mouth 2 (two) times daily.    Historical  Provider, MD  loratadine (CLARITIN) 10 MG tablet Take 10 mg by mouth daily.     Historical Provider, MD  Melatonin 3 MG TABS Take 1 tablet by mouth at bedtime. Give 30 minutes before bedtime    Historical Provider, MD  montelukast (SINGULAIR) 10 MG tablet Take 10 mg by mouth at bedtime.    Historical Provider, MD  Multiple Vitamins-Minerals (CENTRUM SILVER PO) Take 1 tablet by mouth daily.    Historical Provider, MD  risedronate (ACTONEL) 35 MG tablet Take 35 mg by mouth every 7 (seven) days. with water on empty stomach, nothing by mouth or lie down for next 30 minutes.    Historical Provider, MD  senna (SENOKOT) 8.6 MG tablet Take 1 tablet by mouth at bedtime.    Historical Provider, MD  sodium chloride (OCEAN) 0.65 % SOLN nasal spray Place 1 spray into both nostrils as needed for congestion.    Historical Provider, MD  traMADol (ULTRAM) 50 MG tablet Take 25 mg by mouth 3 (three) times daily.     Historical Provider, MD    Allergies Patient has no known allergies.    Social History Social History  Substance Use Topics  . Smoking status: Never Smoker  . Smokeless tobacco: Never Used  . Alcohol use No    Review of Systems Patient denies headaches, rhinorrhea, blurry vision, numbness, shortness of breath, chest pain, edema, cough, abdominal pain, nausea, vomiting, diarrhea, dysuria, fevers, rashes or hallucinations unless  otherwise stated above in HPI. ____________________________________________   PHYSICAL EXAM:  VITAL SIGNS: Vitals:   08/10/16 1625  BP: 118/84  Pulse: 73  Resp: 18  Temp: 97.7 F (36.5 C)    Constitutional: Alert and oriented. Elderly and frail,   in no acute distress. Eyes: Conjunctivae are normal. PERRL. EOMI. Head: Atraumatic. Nose: + congestion/rhinnorhea. Mouth/Throat: Mucous membranes are moist.  Oropharynx non-erythematous. Neck: No stridor. Painless ROM. No cervical spine tenderness to palpation Hematological/Lymphatic/Immunilogical: No cervical  lymphadenopathy. Cardiovascular: Normal rate, regular rhythm. Grossly normal heart sounds.  Good peripheral circulation. Respiratory: Normal respiratory effort.  No retractions. Lungs CTAB. Gastrointestinal: Soft and nontender. No distention. No abdominal bruits. No CVA tenderness. Musculoskeletal: No lower extremity tenderness nor edema.  No joint effusions. Neurologic:  Normal speech and language. No gross focal neurologic deficits are appreciated. No gait instability. Skin:  Skin is warm, dry and intact. No rash noted. Psychiatric: Mood and affect are normal. Speech and behavior are normal.  ____________________________________________   LABS (all labs ordered are listed, but only abnormal results are displayed)  No results found for this or any previous visit (from the past 24 hour(s)). ____________________________________________  EKG ___________________________________  RADIOLOGY  I personally reviewed all radiographic images ordered to evaluate for the above acute complaints and reviewed radiology reports and findings.  These findings were personally discussed with the patient.  Please see medical record for radiology report.  ____________________________________________   PROCEDURES  Procedure(s) performed: none Procedures    Critical Care performed: no ____________________________________________   INITIAL IMPRESSION / ASSESSMENT AND PLAN / ED COURSE  Pertinent labs & imaging results that were available during my care of the patient were reviewed by me and considered in my medical decision making (see chart for details).  DDX: uri, pna, chf, sinusitis  Anna Moss is a 82 y.o. who presents to the ED with chief complaint of several days of nasal congestion associated with cough. Patient denies any chest shortness of breath. She is in no respiratory distress and satting well on room air. Lung sounds are clear. Does have physical exam findings consistent  with upper respiratory infection. Possible bacterial sinusitis. Abdominal exam is reassuring. She denies any chest pain or pressure. Temperature is mildly hyperthermic but no tachycardia or evidence of other respiratory distress. Chest x-ray ordered to evaluate for pneumonia shows no acute thoracic abnormality. Do feel patient is appropriate for further outpatient management with nasal decongestants as well as antibiotics given the duration of her congestion to treat bacterial sinusitis. Discussed signs and symptoms for which the patient to return to the hospital.  Have discussed with the patient and available family all diagnostics and treatments performed thus far and all questions were answered to the best of my ability. The patient demonstrates understanding and agreement with plan.   Clinical Course      ____________________________________________   FINAL CLINICAL IMPRESSION(S) / ED DIAGNOSES  Final diagnoses:  Congestion of nasal sinus  Nasal congestion  Cough      NEW MEDICATIONS STARTED DURING THIS VISIT:  New Prescriptions   No medications on file     Note:  This document was prepared using Dragon voice recognition software and may include unintentional dictation errors.    Willy EddyPatrick Fionna Merriott, MD 08/10/16 [id number]

## 2016-08-10 NOTE — ED Notes (Signed)
Called [facility] @ [phone] phone busy.

## 2016-08-29 ENCOUNTER — Emergency Department
Admission: EM | Admit: 2016-08-29 | Discharge: 2016-08-29 | Disposition: A | Discharge status: Home / Self Care | Payer: Medicare Other | Attending: Emergency Medicine | Admitting: Emergency Medicine

## 2016-08-29 ENCOUNTER — Emergency Department: Payer: Medicare Other

## 2016-08-29 ENCOUNTER — Encounter: Payer: Self-pay | Admitting: Emergency Medicine

## 2016-08-29 DIAGNOSIS — R05 Cough: Secondary | ICD-10-CM | POA: Diagnosis not present

## 2016-08-29 DIAGNOSIS — Z79899 Other long term (current) drug therapy: Secondary | ICD-10-CM | POA: Diagnosis not present

## 2016-08-29 DIAGNOSIS — R0602 Shortness of breath: Secondary | ICD-10-CM | POA: Insufficient documentation

## 2016-08-29 DIAGNOSIS — R059 Cough, unspecified: Secondary | ICD-10-CM

## 2016-08-29 NOTE — ED Notes (Signed)
Assisted the pt with repositioning   She verbalizes  "I am ready to go home - I am alright."   Pt reassured  I questioned her about her shortness of breath and cough  "Oh yeah but I am alright."  Pt reassured again that after the doctor completes his assessment we will know more about when she will return home

## 2016-08-29 NOTE — ED Triage Notes (Addendum)
[facility] arrived to ED [facility] with initial c/o difficulty breathing and productive cough. Pt was said to be 92% on room air upon fire dept arrival to facility; currently 100% on 2L. EMS BP 116/53. Pt currently has no increased work of breathing noted. Pt told EMS that she was a DNR and ready to go any time. Denies chest pain. Pt told this nurse "I don't feel very good. I want to die." pt states she has a lot of saliva and it is bothering her and making her cough.

## 2016-08-29 NOTE — ED Notes (Signed)
[facility]   Discharge complete

## 2016-08-29 NOTE — ED Provider Notes (Signed)
[facility] Emergency Department Provider Note  ____________________________________________   First MD Initiated Contact with Patient 08/29/16 0710     (approximate)  I have reviewed the triage vital signs and the nursing notes.   HISTORY  Chief Complaint Shortness of Breath and Cough   HPI Anna Moss is a 82 y.o. female who is presenting to the emergency department with a chronic cough. The patient is accompanied by her son and he says that the patient has had this cough for years. He says that the cough is unchanged. The patient denies any fever. Says that the cough is worse in the morning and is productive of yellow sputum. She denies any fever or pain. Says that she presented to the emergency department this morning for shortness of breath which she has presented with in the past because of similar presentations.   Past Medical History:  Diagnosis Date  . Arthritis   . Falls frequently   . History of unsteady gait   . Weakness     There are no active problems to display for this patient.   Past Surgical History:  Procedure Laterality Date  . WRIST SURGERY     pt states "abourt six months ago"    Prior to Admission medications   Medication Sig Start Date End Date Taking? Authorizing Provider  acetaminophen (TYLENOL) 500 MG tablet Take 500 mg by mouth 3 (three) times daily. Give between tramadol doses (do not wake patient up)    Historical Provider, MD  albuterol (PROVENTIL HFA;VENTOLIN HFA) 108 (90 Base) MCG/ACT inhaler Inhale 1 puff into the lungs every 6 (six) hours as needed for shortness of breath. 08/10/16   Willy Eddy, MD  Amino Acids-Protein Hydrolys (FEEDING SUPPLEMENT, PRO-STAT SUGAR FREE 64,) LIQD Take 30 mLs by mouth 3 (three) times daily with meals.    Historical Provider, MD  Calcium Carbonate-Vitamin D (CALCIUM 600+D) 600-400 MG-UNIT per tablet Take 1 tablet by mouth daily.    Historical Provider, MD  docusate  sodium (COLACE) 100 MG capsule Take 100 mg by mouth 2 (two) times daily.    Historical Provider, MD  fluticasone (FLONASE) 50 MCG/ACT nasal spray Place 2 sprays into both nostrils daily. 08/10/16 09/09/16  Willy Eddy, MD  loratadine (CLARITIN) 10 MG tablet Take 10 mg by mouth daily.     Historical Provider, MD  Melatonin 3 MG TABS Take 1 tablet by mouth at bedtime. Give 30 minutes before bedtime    Historical Provider, MD  montelukast (SINGULAIR) 10 MG tablet Take 10 mg by mouth at bedtime.    Historical Provider, MD  Multiple Vitamins-Minerals (CENTRUM SILVER PO) Take 1 tablet by mouth daily.    Historical Provider, MD  risedronate (ACTONEL) 35 MG tablet Take 35 mg by mouth every 7 (seven) days. with water on empty stomach, nothing by mouth or lie down for next 30 minutes.    Historical Provider, MD  senna (SENOKOT) 8.6 MG tablet Take 1 tablet by mouth at bedtime.    Historical Provider, MD  sodium chloride (OCEAN) 0.65 % SOLN nasal spray Place 1 spray into both nostrils as needed for congestion.    Historical Provider, MD  traMADol (ULTRAM) 50 MG tablet Take 25 mg by mouth 3 (three) times daily.     Historical Provider, MD    Allergies Patient has no known allergies.  History reviewed. No pertinent family history.  Social History Social History  Substance Use Topics  . Smoking status: Never Smoker  .  Smokeless tobacco: Never Used  . Alcohol use No    Review of Systems Constitutional: No fever/chills Eyes: No visual changes. ENT: No sore throat. Cardiovascular: Denies chest pain. Respiratory:As above Gastrointestinal: No abdominal pain.  No nausea, no vomiting.  No diarrhea.  No constipation. Genitourinary: Negative for dysuria. Musculoskeletal: Negative for back pain. Skin: Negative for rash. Neurological: Negative for headaches, focal weakness or numbness.  10-point ROS otherwise negative.  ____________________________________________   PHYSICAL EXAM:  VITAL  SIGNS: ED Triage Vitals  Enc Vitals Group     BP --      Pulse Rate 08/29/16 0623 63     Resp 08/29/16 0623 20     Temp 08/29/16 0623 97.4 F (36.3 C)     Temp Source 08/29/16 0623 Oral     SpO2 08/29/16 0623 96 %     Weight 08/29/16 0625 76 lb (34.5 kg)     Height 08/29/16 0625 5\' 4"  (1.626 m)     Head Circumference --      Peak Flow --      Pain Score 08/29/16 0625 0     Pain Loc --      Pain Edu? --      Excl. in GC? --     Constitutional: Alert and oriented. Well appearing and in no acute distress.  Occasional cough productive of yellow sputum. Eyes: Conjunctivae are normal. PERRL. EOMI. Head: Atraumatic. Nose: No congestion/rhinnorhea. Mouth/Throat: Mucous membranes are moist.  Oropharynx non-erythematous. Neck: No stridor.   Cardiovascular: Normal rate, regular rhythm. Grossly normal heart sounds.   Respiratory: Normal respiratory effort.  No retractions. Lungs CTAB. Gastrointestinal: Soft and nontender. No distention.  Musculoskeletal: No lower extremity tenderness nor edema.  No joint effusions. Neurologic:  Normal speech and language. No gross focal neurologic deficits are appreciated.  Skin:  Skin is warm, dry and intact. No rash noted. Psychiatric: Mood and affect are normal. Speech and behavior are normal.  ____________________________________________   LABS (all labs ordered are listed, but only abnormal results are displayed)  Labs Reviewed - No data to display ____________________________________________  EKG  ED ECG REPORT I, Joenathan Sakuma,  Teena Iraniavid M, the attending physician, personally viewed and interpreted this ECG.   Date: 08/29/2016  EKG Time: 0629  Rate: 73  Rhythm: normal sinus rhythm  Axis: Normal  Intervals:none  ST&T Change: No ST segment elevation or depression. No abnormal T-wave inversion. Borderline ST elevation in anterior leads by the EKG machine. However, this is likely secondary to baseline  wander.  ____________________________________________  RADIOLOGY  DG Chest Port 1 View (Final result)  Result time 08/29/16 08:44:22  Final result by Jolaine ClickArthur Hoss, MD (08/29/16 08:44:22)           Narrative:   CLINICAL DATA: Difficulty breathing. Productive cough.  EXAM: PORTABLE CHEST 1 VIEW  COMPARISON: 08/10/2016  FINDINGS: Hyperaeration. Moderate cardiomegaly. Normal vascularity. Chronic pleural changes at the lung apices. Left shoulder hemiarthroplasty is in place.  IMPRESSION: Chronic changes. No active cardiopulmonary disease.   Electronically Signed By: Jolaine ClickArthur Hoss M.D. On: 08/29/2016 08:44          ____________________________________________   PROCEDURES  Procedure(s) performed:   Procedures  Critical Care performed:   ____________________________________________   INITIAL IMPRESSION / ASSESSMENT AND PLAN / ED COURSE  Pertinent labs & imaging results that were available during my care of the patient were reviewed by me and considered in my medical decision making (see chart for details).  Despite patient hypoxia prior to arrival the  patient is 95-96% on room air in the room during my exam.  Clinical Course    ----------------------------------------- 9:19 AM on 08/29/2016 -----------------------------------------  Patient with reassuring chest x-ray. Likely chronic rhinitis with postnasal drip. Has fluticasone that was recently prescribed. We'll discharge to home. I discussed the case with her son prior to the x-ray returning. We agreed that the x-ray was normal which revealed to be discharged home. Patient is a reassuring oxygen saturation. I asked her and she says she feels back to her baseline at this time.  ____________________________________________   FINAL CLINICAL IMPRESSION(S) / ED DIAGNOSES  Cough.    NEW MEDICATIONS STARTED DURING THIS VISIT:  New Prescriptions   No medications on file     Note:  This  document was prepared using Dragon voice recognition software and may include unintentional dictation errors.    Myrna Blazeravid Matthew Rajan Burgard, MD 08/29/16 [phone]

## 2016-08-29 NOTE — ED Notes (Signed)
Answered call bell - meal ordered per pt request  NAD assessed  Continue to monitor

## 2016-11-08 DEATH — deceased

## 2017-07-26 IMAGING — CT CT HEAD W/O CM
3 series · 16 of 47 positions shown, 19 images · non-contrast
Comparison: 03/01/2015

CLINICAL DATA: Status post fall, may have hit head

EXAM:
CT HEAD WITHOUT CONTRAST
TECHNIQUE: Contiguous axial images were obtained from the base of the skull
through the vertex without intravenous contrast.

[Series 2: head wo · axial · 0.42mm/px · z∈[+182,+307]mm · 10 of 30 slices shown, 13 images]
[im 3/30  brain]
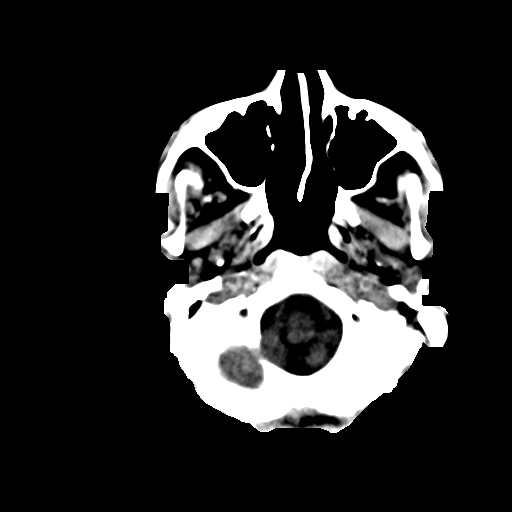
[im 3/30  bone]
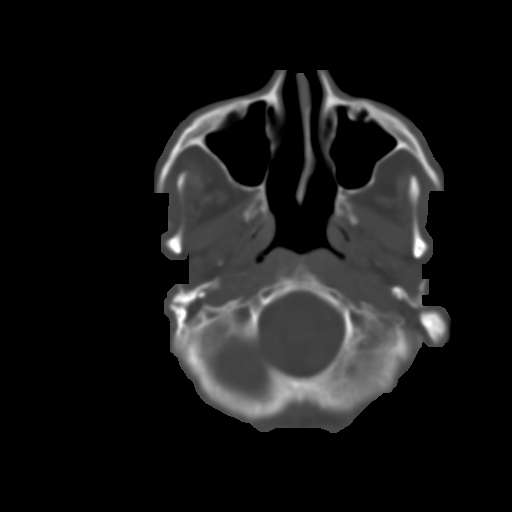
[im 6/30  brain]
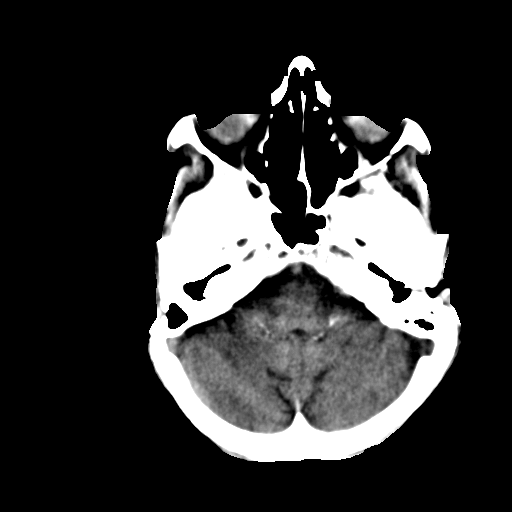
[im 9/30  brain]
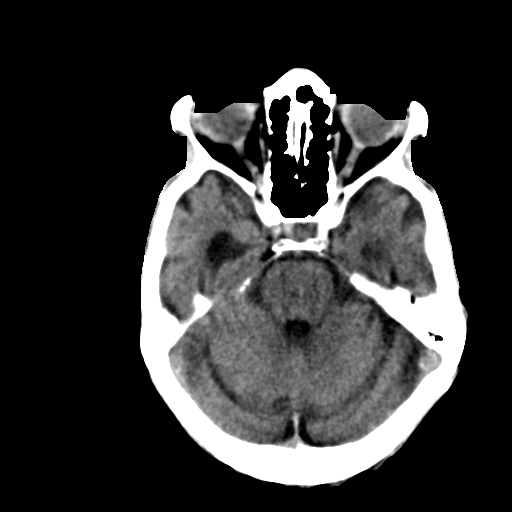
[im 11/30  brain]
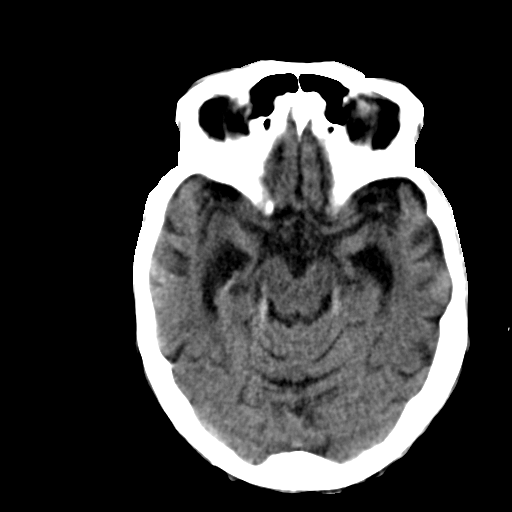
[im 14/30  brain]
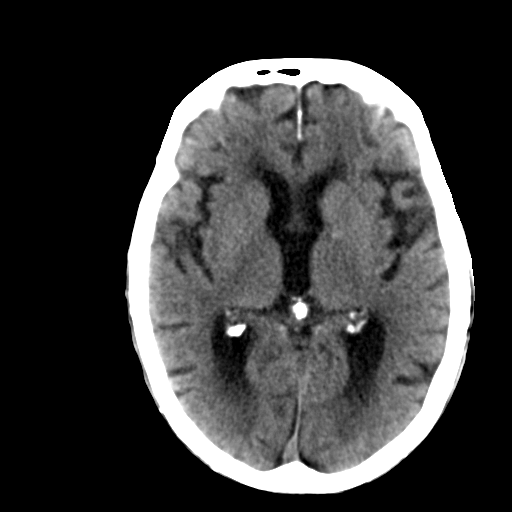
[im 14/30  bone]
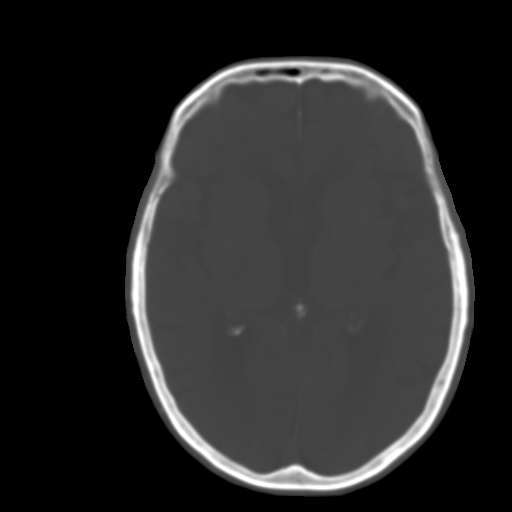
[im 17/30  brain]
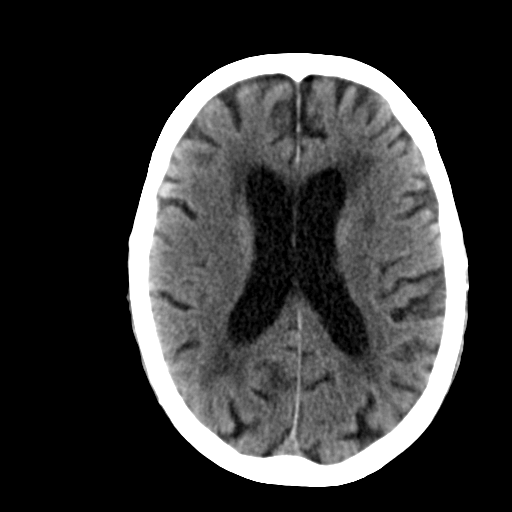
[im 20/30  brain]
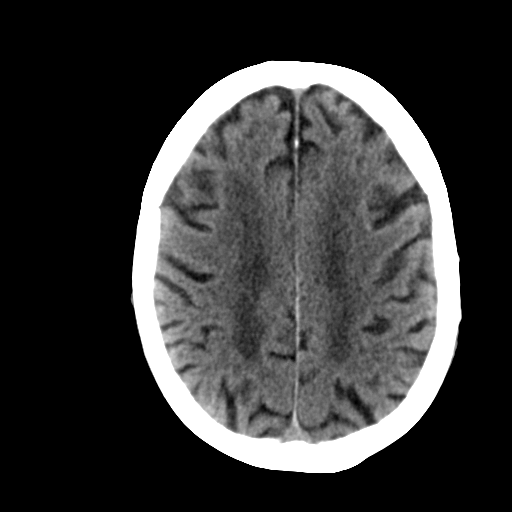
[im 23/30  brain]
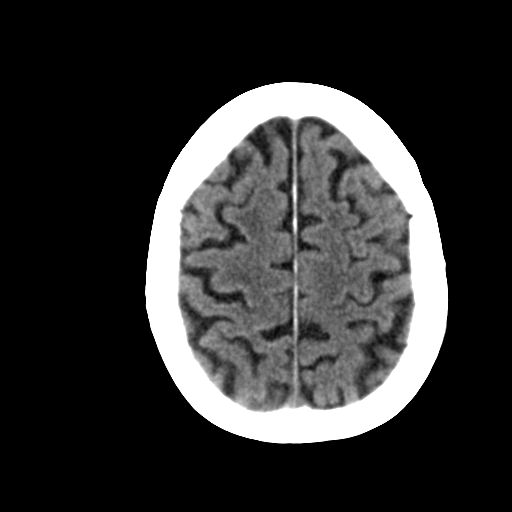
[im 25/30  brain]
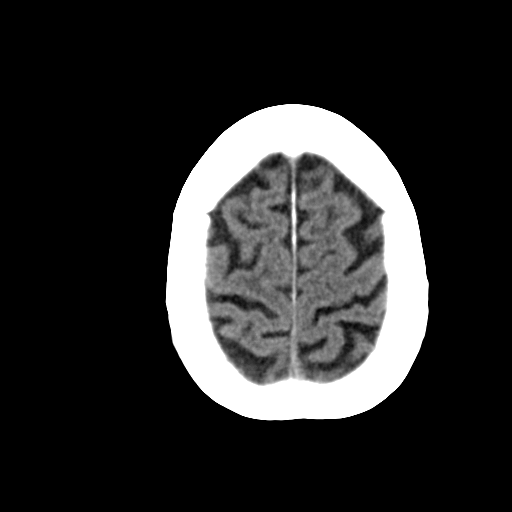
[im 25/30  bone]
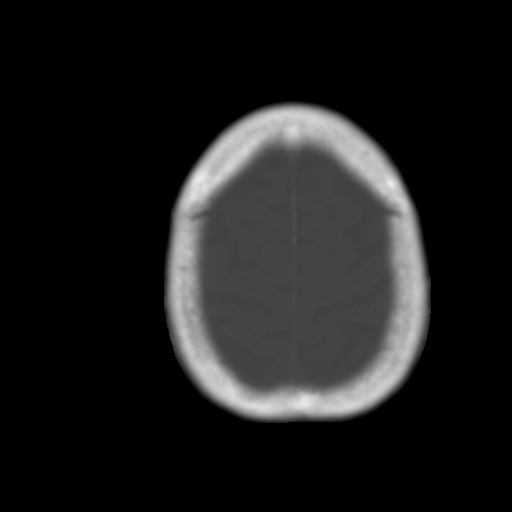
[im 28/30  brain]
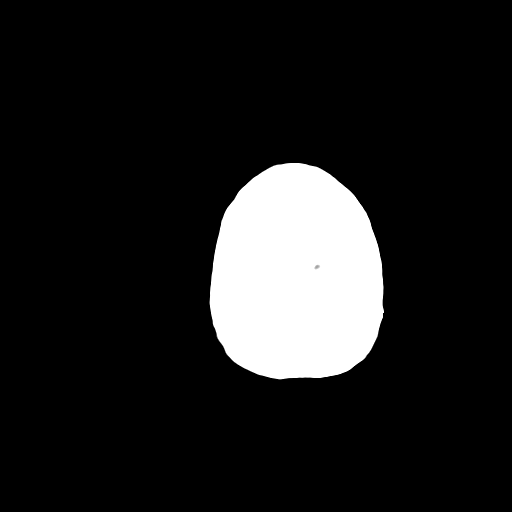

[Series 4: coronal soft tissue · coronal · 0.31mm/px · 3 of 61 slices shown]
[im 21/61  brain]
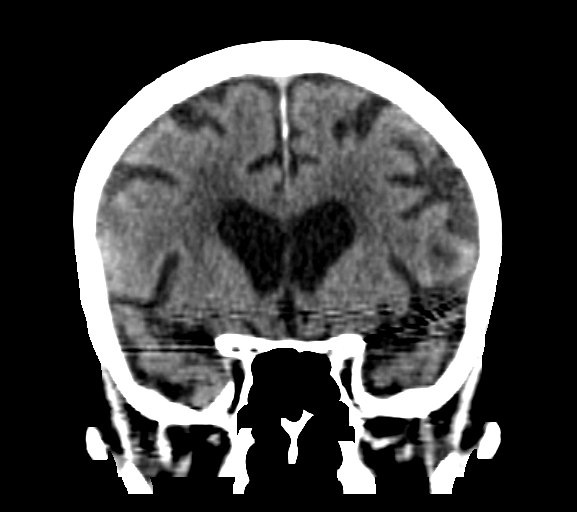
[im 27/61  brain]
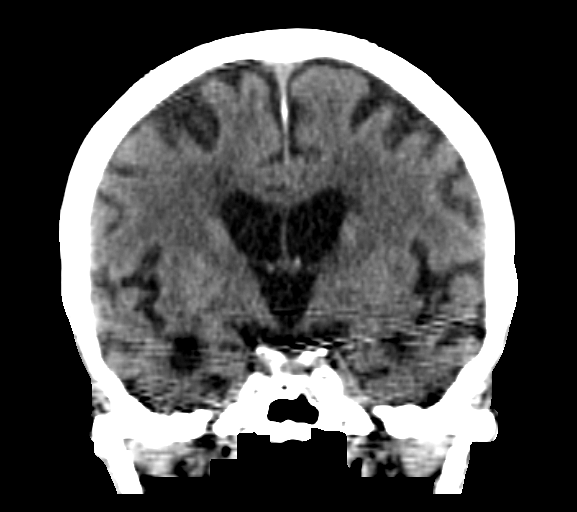
[im 34/61  brain]
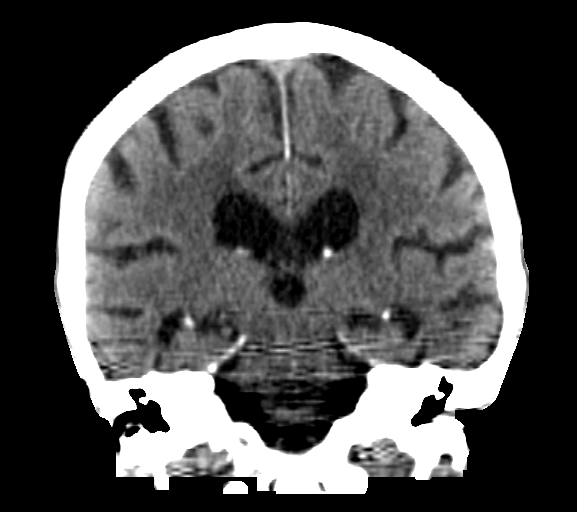

[Series 5: sagittal soft tissue · sagittal · 0.34mm/px · 3 of 48 slices shown]
[im 16/48  brain]
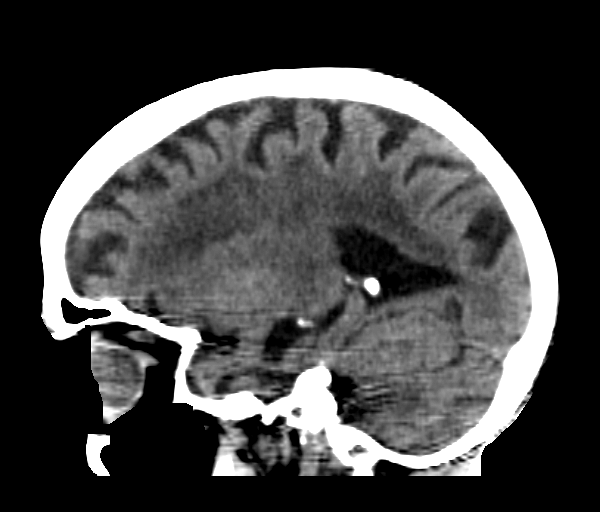
[im 24/48  brain]
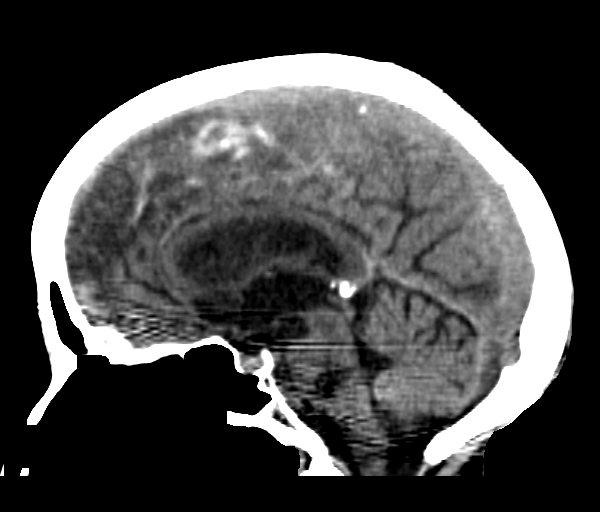
[im 32/48  brain]
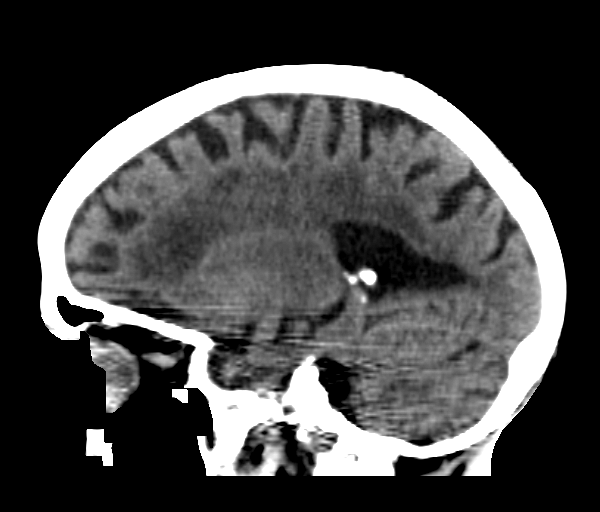

[16 of 47 positions shown; findings below may reference images not displayed]

FINDINGS: Brain: No evidence of acute infarction, hemorrhage, extra-axial
collection, ventriculomegaly, or mass effect. Generalized cerebral
atrophy. Periventricular white matter low attenuation likely
secondary to microangiopathy.

Vascular: Cerebrovascular atherosclerotic calcifications are noted.

Skull: Negative for fracture or focal lesion.

Sinuses/Orbits: Visualized portions of the orbits are unremarkable.
Visualized portions of the paranasal sinuses and mastoid air cells
are unremarkable.

Other: None.
IMPRESSION: 1. No acute intracranial pathology.
2. Chronic microvascular disease and cerebral atrophy.

## 2017-07-26 IMAGING — CR DG FOOT COMPLETE 3+V*L*
3 series · 3 of 3 positions shown · non-contrast
Comparison: None

CLINICAL DATA: Fell getting up from a chair, LEFT heel pain, LEFT
hip pain, initial encounter

EXAM:
LEFT FOOT - COMPLETE 3+ VIEW

[foot ap]
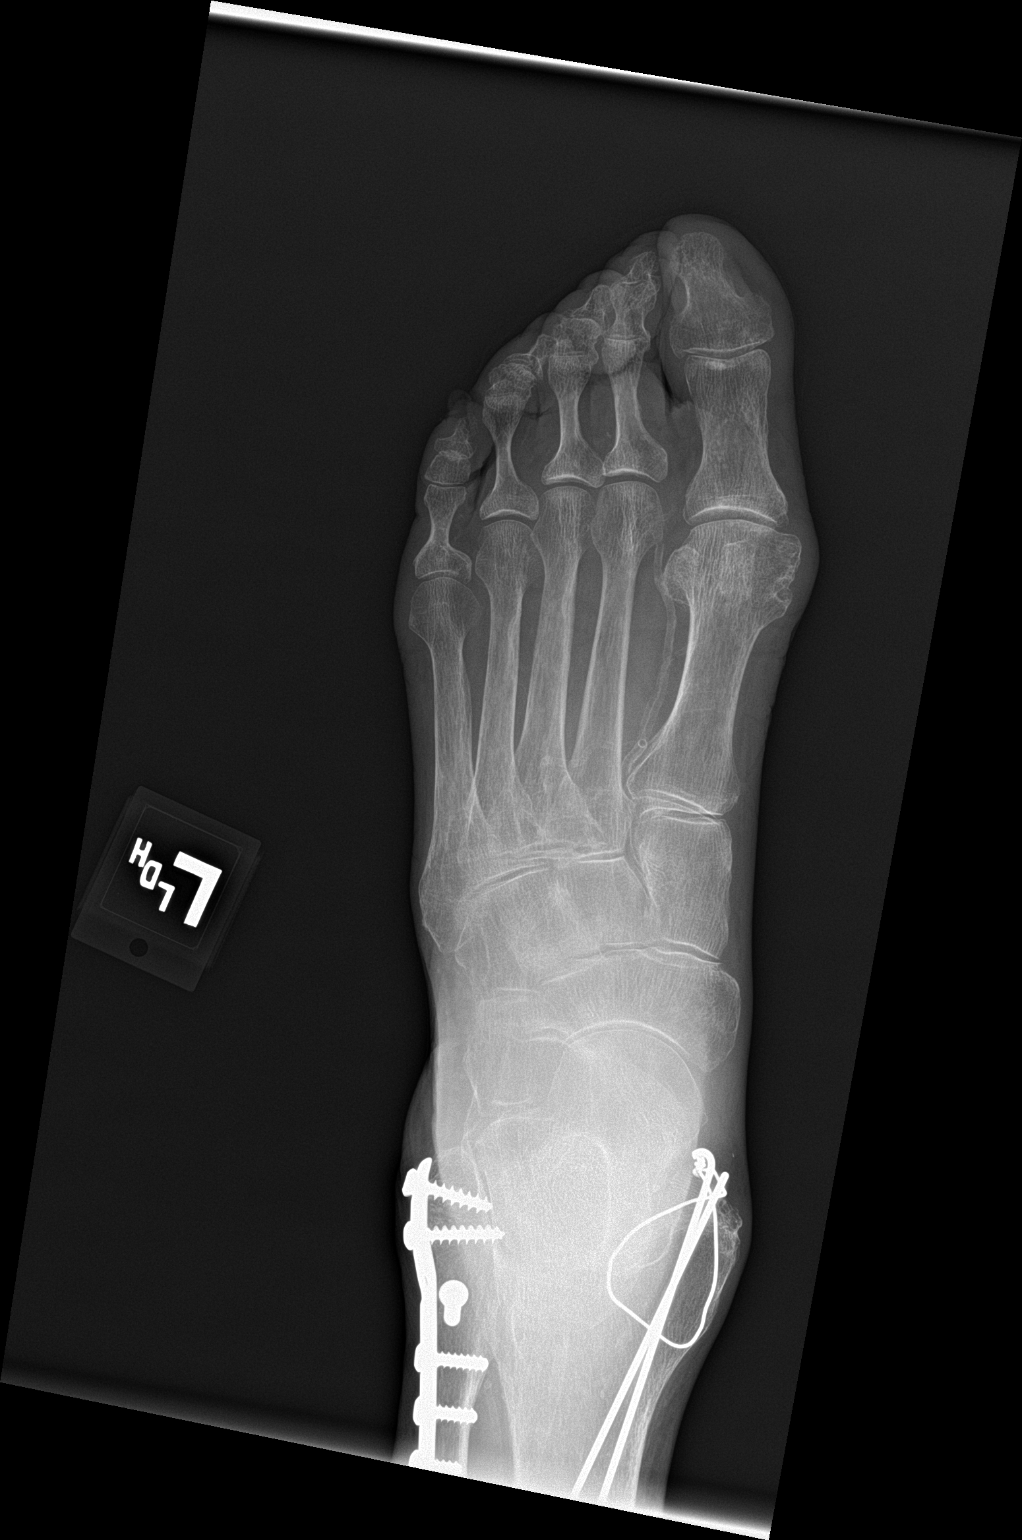

[foot obl]
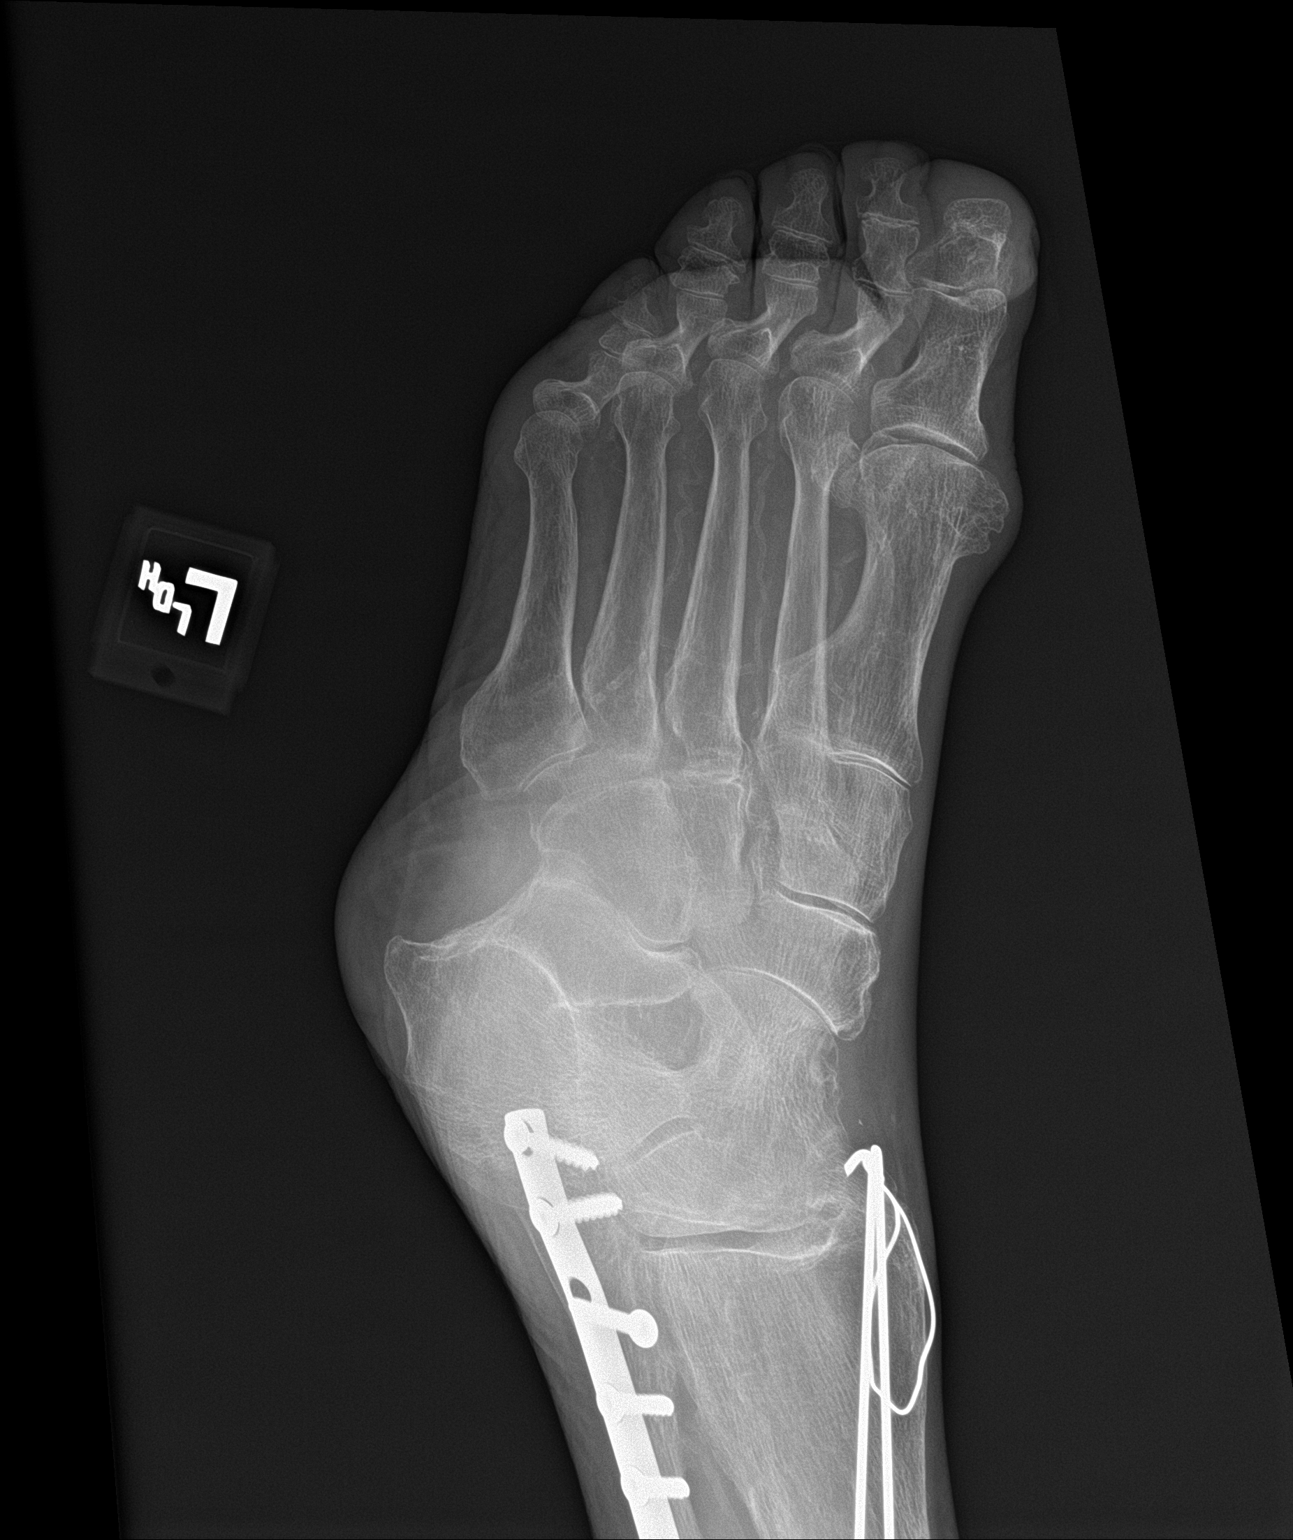

[foot lat]
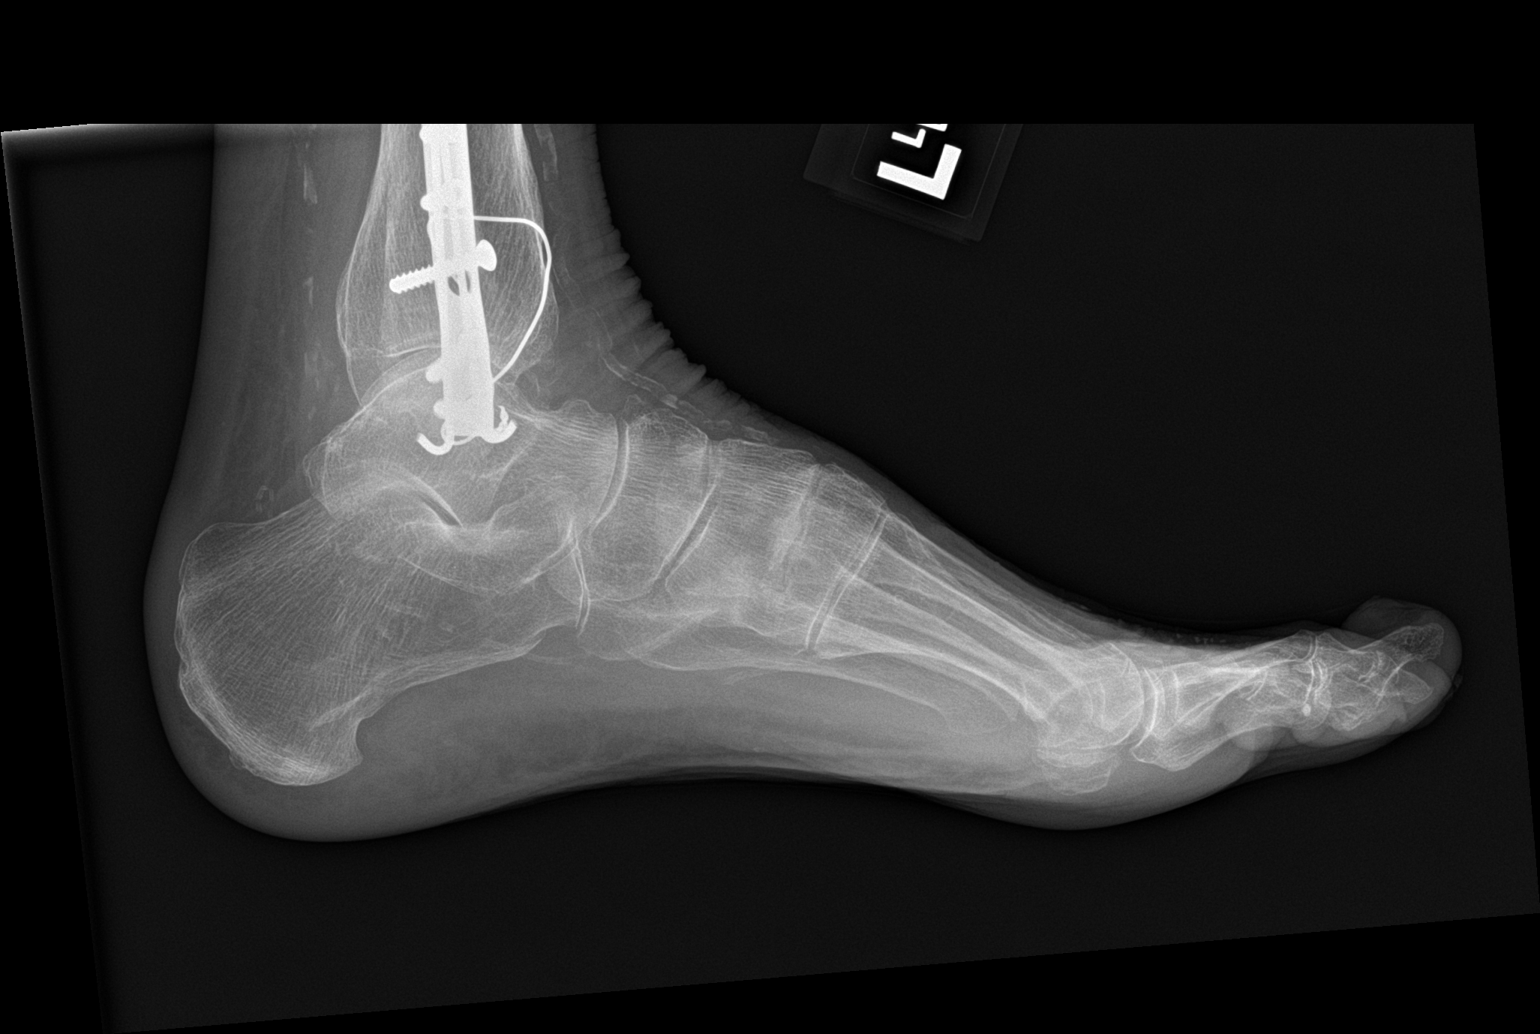

[3 of 3 positions shown; findings below may reference images not displayed]

FINDINGS: Prior bimalleolar ORIF.

Diffuse osseous demineralization.

Joint spaces preserved.

Bony overgrowth at medial margin of first metatarsal head.

No acute fracture, dislocation, or bone destruction.

Specifically, calcaneus appears grossly intact.

Extensive small vessel vascular calcification
IMPRESSION: No acute osseous abnormalities.
